# Patient Record
Sex: Female | Born: 1995
Health system: Southern US, Community
[De-identification: ages and names within clinical notes are randomized; demographics above are authoritative.]

## PROBLEM LIST (undated history)

## (undated) DIAGNOSIS — T7840XA Allergy, unspecified, initial encounter: Secondary | ICD-10-CM

## (undated) HISTORY — PX: WISDOM TOOTH EXTRACTION: SHX21

## (undated) HISTORY — PX: ANTERIOR CRUCIATE LIGAMENT REPAIR: SHX115

## (undated) HISTORY — DX: Allergy, unspecified, initial encounter: T78.40XA

## (undated) HISTORY — PX: MENISCUS REPAIR: SHX5179

---
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
## 2015-04-10 ENCOUNTER — Ambulatory Visit (INDEPENDENT_AMBULATORY_CARE_PROVIDER_SITE_OTHER): Payer: PRIVATE HEALTH INSURANCE

## 2015-04-10 ENCOUNTER — Ambulatory Visit (INDEPENDENT_AMBULATORY_CARE_PROVIDER_SITE_OTHER): Payer: PRIVATE HEALTH INSURANCE | Admitting: Emergency Medicine

## 2015-04-10 VITALS — BP 122/76 | HR 61 | Temp 98.3°F | Resp 16 | Ht 65.5 in | Wt 184.0 lb

## 2015-04-10 DIAGNOSIS — N912 Amenorrhea, unspecified: Secondary | ICD-10-CM

## 2015-04-10 LAB — POCT URINE PREGNANCY: PREG TEST UR: NEGATIVE

## 2015-04-10 NOTE — Patient Instructions (Signed)
Wrist Sprain  with Rehab  A sprain is an injury in which a ligament that maintains the proper alignment of a joint is partially or completely torn. The ligaments of the wrist are susceptible to sprains. Sprains are classified into three categories. Grade 1 sprains cause pain, but the tendon is not lengthened. Grade 2 sprains include a lengthened ligament because the ligament is stretched or partially ruptured. With grade 2 sprains there is still function, although the function may be diminished. Grade 3 sprains are characterized by a complete tear of the tendon or muscle, and function is usually impaired.  SYMPTOMS   · Pain tenderness, inflammation, and/or bruising (contusion) of the injury.  · A "pop" or tear felt and/or heard at the time of injury.  · Decreased wrist function.  CAUSES   A wrist sprain occurs when a force is placed on one or more ligaments that is greater than it/they can withstand. Common mechanisms of injury include:  · Catching a ball with you hands.  · Repetitive and/ or strenuous extension or flexion of the wrist.  RISK INCREASES WITH:  · Previous wrist injury.  · Contact sports (boxing or wrestling).  · Activities in which falling is common.  · Poor strength and flexibility.  · Improperly fitted or padded protective equipment.  PREVENTION  · Warm up and stretch properly before activity.  · Allow for adequate recovery between workouts.  · Maintain physical fitness:  ¨ Strength, flexibility, and endurance.  ¨ Cardiovascular fitness.  · Protect the wrist joint by limiting its motion with the use of taping, braces, or splints.  · Protect the wrist after injury for 6 to 12 months.  PROGNOSIS   The prognosis for wrist sprains depends on the degree of injury. Grade 1 sprains require 2 to 6 weeks of treatment. Grade 2 sprains require 6 to 8 weeks of treatment, and grade 3 sprains require up to 12 weeks.   RELATED COMPLICATIONS   · Prolonged healing time, if improperly treated or  re-injured.  · Recurrent symptoms that result in a chronic problem.  · Injury to nearby structures (bone, cartilage, nerves, or tendons).  · Arthritis of the wrist.  · Inability to compete in athletics at a high level.  · Wrist stiffness or weakness.  · Progression to a complete rupture of the ligament.  TREATMENT   Treatment initially involves resting from any activities that aggravate the symptoms, and the use of ice and medications to help reduce pain and inflammation. Your caregiver may recommend immobilizing the wrist for a period of time in order to reduce stress on the ligament and allow for healing. After immobilization it is important to perform strengthening and stretching exercises to help regain strength and a full range of motion. These exercises may be completed at home or with a therapist. Surgery is not usually required for wrist sprains, unless the ligament has been ruptured (grade 3 sprain).  MEDICATION   · If pain medication is necessary, then nonsteroidal anti-inflammatory medications, such as aspirin and ibuprofen, or other minor pain relievers, such as acetaminophen, are often recommended.  · Do not take pain medication for 7 days before surgery.  · Prescription pain relievers may be given if deemed necessary by your caregiver. Use only as directed and only as much as you need.  HEAT AND COLD  · Cold treatment (icing) relieves pain and reduces inflammation. Cold treatment should be applied for 10 to 15 minutes every 2 to 3 hours for inflammation   and pain and immediately after any activity that aggravates your symptoms. Use ice packs or massage the area with a piece of ice (ice massage).  · Heat treatment may be used prior to performing the stretching and strengthening activities prescribed by your caregiver, physical therapist, or athletic trainer. Use a heat pack or soak your injury in warm water.  SEEK MEDICAL CARE IF:  · Treatment seems to offer no benefit, or the condition worsens.  · Any  medications produce adverse side effects.  EXERCISES  RANGE OF MOTION (ROM) AND STRETCHING EXERCISES - Wrist Sprain   These exercises may help you when beginning to rehabilitate your injury. Your symptoms may resolve with or without further involvement from your physician, physical therapist or athletic trainer. While completing these exercises, remember:   · Restoring tissue flexibility helps normal motion to return to the joints. This allows healthier, less painful movement and activity.  · An effective stretch should be held for at least 30 seconds.  · A stretch should never be painful. You should only feel a gentle lengthening or release in the stretched tissue.  RANGE OF MOTION - Wrist Flexion, Active-Assisted  · Extend your right / left elbow with your fingers pointing down.*  · Gently pull the back of your hand towards you until you feel a gentle stretch on the top of your forearm.  · Hold this position for __________ seconds.  Repeat __________ times. Complete this exercise __________ times per day.   *If directed by your physician, physical therapist or athletic trainer, complete this stretch with your elbow bent rather than extended.  RANGE OF MOTION - Wrist Extension, Active-Assisted  · Extend your right / left elbow and turn your palm upwards.*  · Gently pull your palm/fingertips back so your wrist extends and your fingers point more toward the ground.  · You should feel a gentle stretch on the inside of your forearm.  · Hold this position for __________ seconds.  Repeat __________ times. Complete this exercise __________ times per day.  *If directed by your physician, physical therapist or athletic trainer, complete this stretch with your elbow bent, rather than extended.  RANGE OF MOTION - Supination, Active  · Stand or sit with your elbows at your side. Bend your right / left elbow to 90 degrees.  · Turn your palm upward until you feel a gentle stretch on the inside of your forearm.  · Hold this  position for __________ seconds. Slowly release and return to the starting position.  Repeat __________ times. Complete this stretch __________ times per day.   RANGE OF MOTION - Pronation, Active  · Stand or sit with your elbows at your side. Bend your right / left elbow to 90 degrees.  · Turn your palm downward until you feel a gentle stretch on the top of your forearm.  · Hold this position for __________ seconds. Slowly release and return to the starting position.  Repeat __________ times. Complete this stretch __________ times per day.   STRETCH - Wrist Flexion  · Place the back of your right / left hand on a tabletop leaving your elbow slightly bent. Your fingers should point away from your body.  · Gently press the back of your hand down onto the table by straightening your elbow. You should feel a stretch on the top of your forearm.  · Hold this position for __________ seconds.  Repeat __________ times. Complete this stretch __________ times per day.   STRETCH - Wrist   Extension  · Place your right / left fingertips on a tabletop leaving your elbow slightly bent. Your fingers should point backwards.  · Gently press your fingers and palm down onto the table by straightening your elbow. You should feel a stretch on the inside of your forearm.  · Hold this position for __________ seconds.  Repeat __________ times. Complete this stretch __________ times per day.   STRENGTHENING EXERCISES - Wrist Sprain  These exercises may help you when beginning to rehabilitate your injury. They may resolve your symptoms with or without further involvement from your physician, physical therapist or athletic trainer. While completing these exercises, remember:   · Muscles can gain both the endurance and the strength needed for everyday activities through controlled exercises.  · Complete these exercises as instructed by your physician, physical therapist or athletic trainer. Progress with the resistance and repetition exercises  only as your caregiver advises.  STRENGTH - Wrist Flexors  · Sit with your right / left forearm palm-up and fully supported. Your elbow should be resting below the height of your shoulder. Allow your wrist to extend over the edge of the surface.  · Loosely holding a __________ weight or a piece of rubber exercise band/tubing, slowly curl your hand up toward your forearm.  · Hold this position for __________ seconds. Slowly lower the wrist back to the starting position in a controlled manner.  Repeat __________ times. Complete this exercise __________ times per day.   STRENGTH - Wrist Extensors  · Sit with your right / left forearm palm-down and fully supported. Your elbow should be resting below the height of your shoulder. Allow your wrist to extend over the edge of the surface.  · Loosely holding a __________ weight or a piece of rubber exercise band/tubing, slowly curl your hand up toward your forearm.  · Hold this position for __________ seconds. Slowly lower the wrist back to the starting position in a controlled manner.  Repeat __________ times. Complete this exercise __________ times per day.   STRENGTH - Ulnar Deviators  · Stand with a ____________________ weight in your right / left hand, or sit holding on to the rubber exercise band/tubing with your opposite arm supported.  · Move your wrist so that your pinkie travels toward your forearm and your thumb moves away from your forearm.  · Hold this position for __________ seconds and then slowly lower the wrist back to the starting position.  Repeat __________ times. Complete this exercise __________ times per day  STRENGTH - Radial Deviators  · Stand with a ____________________ weight in your  · right / left hand, or sit holding on to the rubber exercise band/tubing with your arm supported.  · Raise your hand upward in front of you or pull up on the rubber tubing.  · Hold this position for __________ seconds and then slowly lower the wrist back to the  starting position.  Repeat __________ times. Complete this exercise __________ times per day.  STRENGTH - Forearm Supinators  · Sit with your right / left forearm supported on a table, keeping your elbow below shoulder height. Rest your hand over the edge, palm down.  · Gently grip a hammer or a soup ladle.  · Without moving your elbow, slowly turn your palm and hand upward to a "thumbs-up" position.  · Hold this position for __________ seconds. Slowly return to the starting position.  Repeat __________ times. Complete this exercise __________ times per day.   STRENGTH - Forearm   Pronators  · Sit with your right / left forearm supported on a table, keeping your elbow below shoulder height. Rest your hand over the edge, palm up.  · Gently grip a hammer or a soup ladle.  · Without moving your elbow, slowly turn your palm and hand upward to a "thumbs-up" position.  · Hold this position for __________ seconds. Slowly return to the starting position.  Repeat __________ times. Complete this exercise __________ times per day.   STRENGTH - Grip  · Grasp a tennis ball, a dense sponge, or a large, rolled sock in your hand.  · Squeeze as hard as you can without increasing any pain.  · Hold this position for __________ seconds. Release your grip slowly.  Repeat __________ times. Complete this exercise __________ times per day.   Document Released: 09/07/2005 Document Revised: 11/30/2011 Document Reviewed: 12/20/2008  ExitCare® Patient Information ©2015 [employer]. This information is not intended to replace advice given to you by your health care provider. Make sure you discuss any questions you have with your health care provider.

## 2015-04-10 NOTE — Progress Notes (Signed)
Patient ID: Brandy Lee, female   DOB: 1996/09/14, 19 y.o.   MRN: [id number]     This chart was scribed for Lesle Chris, MD by Littie Deeds, Medical Scribe. This patient was seen in room 2 and the patient's care was started at 3:22 PM.   Chief Complaint:  Chief Complaint  Patient presents with  . Wrist Injury    last night, right    HPI: Brandy Lee is a 19 y.o. female who reports to [facility] today complaining of sudden onset, worsening right wrist pain that started yesterday after lifting a cinder block. Last night, she had some difficulty with ROM of her wrist, which worsened this morning. She could not make a fist this morning. Patient has been icing her wrist and taking ibuprofen, which has helped with her ROM. She is not sexually active and denies possibility of pregnancy.   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  Past Medical History  Diagnosis Date  . Allergy    History reviewed. No pertinent past surgical history. History   Social History  . Marital Status: Single    Spouse Name: N/A  . Number of Children: N/A  . Years of Education: N/A   Social History Main Topics  . Smoking status: Never Smoker   . Smokeless tobacco: Not on file  . Alcohol Use: Not on file  . Drug Use: Not on file  . Sexual Activity: Not on file   Other Topics Concern  . None   Social History Narrative  . None   Family History  Problem Relation Age of Onset  . Hypertension Mother    No Known Allergies Prior to Admission medications   Medication Sig Start Date End Date Taking? Authorizing Provider  metFORMIN (GLUCOPHAGE) 500 MG tablet Take by mouth 2 (two) times daily with a meal.   Yes Historical Provider, MD     ROS: The patient denies fevers, chills, night sweats, unintentional weight loss, chest pain, palpitations, wheezing, dyspnea on exertion, nausea, vomiting, abdominal pain, dysuria, hematuria, melena, numbness, weakness, or tingling.   All  other systems have been reviewed and were otherwise negative with the exception of those mentioned in the HPI and as above.    PHYSICAL EXAM: Filed Vitals:   04/10/15 1518  BP: 122/76  Pulse: 61  Temp: 98.3 F (36.8 C)  Resp: 16   Body mass index is 30.14 kg/(m^2).   General: Alert, no acute distress HEENT:  Normocephalic, atraumatic, oropharynx patent. Eye: Nonie Hoyer [facility] Cardiovascular:  Regular rate and rhythm, no rubs murmurs or gallops.  No Carotid bruits, radial pulse intact. No pedal edema.  Respiratory: Clear to auscultation bilaterally.  No wheezes, rales, or rhonchi.  No cyanosis, no use of accessory musculature Abdominal: No organomegaly, abdomen is soft and non-tender, positive bowel sounds.  No masses. Musculoskeletal: Gait intact. No edema. Tender along the ulnar styloid and ulnar flexor attachments to the carpal bones Skin: No rashes. Neurologic: Facial musculature symmetric. Psychiatric: Patient acts appropriately throughout our interaction. Lymphatic: No cervical or submandibular lymphadenopathy Genitourinary/Anorectal: No acute findings    LABS: Results for orders placed or performed in visit on 04/10/15  POCT urine pregnancy  Result Value Ref Range   Preg Test, Ur Negative Negative      EKG/XRAY:   Primary read interpreted by Dr. Cleta Alberts at [facility]. No fracture seen   ASSESSMENT/PLAN: I did not see a fracture on x-ray. Will treat with a splint ice and ibuprofen.  She was given a note for work to not do any heavy lifting with the right wrist.I personally performed the services described in this documentation, which was scribed in my presence. The recorded information has been reviewed and is accurate.  Earl LitesSteve Daub, MD     Gross sideeffects, risk and benefits, and alternatives of medications d/w patient. Patient is aware that all medications have potential sideeffects and we are unable to predict every sideeffect or drug-drug interaction that may  occur.  Lesle ChrisSteven Daub MD 04/10/2015 3:22 PM

## 2015-09-19 ENCOUNTER — Encounter: Payer: PRIVATE HEALTH INSURANCE | Admitting: Certified Nurse Midwife

## 2016-12-08 NOTE — Progress Notes (Signed)
[name] [address] [place], [place] [address] Phone: [phone] Subjective:    I'm seeing this patient by the request  of:    CC: Back pain  WGN:[id number]  Brandy Lee is a 21 y.o. female coming in with complaint of back pain  Patient is a Engineer, maintenance (IT). Patient has had pain more recently. Patient has seen other providers for this. Was told that she had a potential bulging disc. Had been working with athletic trainers but continued to work on her rowing. Never seem to get completely better. Patient has seen a chiropractor with minimal improvement. Patient states that the pain seemed to stay localized but did start with sciatica. Patient started having worsening worsening pain in the buttocks area as well as radiating to the leg somewhat. Patient rates the severity of pain as 5 out of 10. Seems to be on time. Anytime she tries to work on though she has worsening symptoms.     Past Medical History:  Diagnosis Date  . Allergy    Past Surgical History:  Procedure Laterality Date  . ANTERIOR CRUCIATE LIGAMENT REPAIR    . MENISCUS REPAIR    . WISDOM TOOTH EXTRACTION     Social History   Social History  . Marital status: Single    Spouse name: N/A  . Number of children: N/A  . Years of education: N/A   Social History Main Topics  . Smoking status: Never Smoker  . Smokeless tobacco: Never Used  . Alcohol use Not on file  . Drug use: Unknown  . Sexual activity: Not on file   Other Topics Concern  . Not on file   Social History Narrative  . No narrative on file   No Known Allergies Family History  Problem Relation Age of Onset  . Hypertension Mother     Past medical history, social, surgical and family history all reviewed in electronic medical record.  No pertanent information unless stated regarding to the chief complaint.   Review of Systems:Review of systems updated and as accurate as of 12/09/16  No headache, visual changes,  nausea, vomiting, diarrhea, constipation, dizziness, abdominal pain, skin rash, fevers, chills, night sweats, weight loss, swollen lymph nodes, body aches, joint swelling, muscle aches, chest pain, shortness of breath, mood changes.   Objective  Blood pressure 118/82, pulse 64, height 5' 4.5" (1.638 m), weight 193 lb (87.5 kg). Systems examined below as of 12/09/16   General: No apparent distress alert and oriented x3 mood and affect normal, dressed appropriately.  HEENT: Pupils equal, extraocular movements intact  Respiratory: Patient's speak in full sentences and does not appear short of breath  Cardiovascular: No lower extremity edema, non tender, no erythema  Skin: Warm dry intact with no signs of infection or rash on extremities or on axial skeleton.  Abdomen: Soft nontender  Neuro: Cranial nerves II through XII are intact, neurovascularly intact in all extremities with 2+ DTRs and 2+ pulses.  Lymph: No lymphadenopathy of posterior or anterior cervical chain or axillae bilaterally.  Gait normal with good balance and coordination.  MSK:  Non tender with full range of motion and good stability and symmetric strength and tone of shoulders, elbows, wrist, hip, knee and ankles bilaterally.  Back Exam:  Inspection: Loss of lordosis Motion: Flexion 45 deg, Extension 15 deg, Side Bending to 45 deg bilaterally,  Rotation to 45 deg bilaterally  SLR laying: Negative  XSLR laying: Negative  Palpable tenderness: Tender to palpation in  the paraspinal musculature of the lumbar spine right greater than left. Patient also has severe tenderness over the right sacroiliac joint.Marland Kitchen. FABER: negative. Sensory change: Gross sensation intact to all lumbar and sacral dermatomes.  Reflexes: 2+ at both patellar tendons, 2+ at achilles tendons, Babinski's downgoing.  Strength at foot  Plantar-flexion: 5/5 Dorsi-flexion: 5/5 Eversion: 5/5 Inversion: 5/5  Leg strength  Quad: 5/5 Hamstring: 5/5 Hip flexor: 5/5 Hip  abductors: 5/5  Gait unremarkable. Positive stork test   procedure note 97110; 15 minutes spent for Therapeutic exercises as stated in above notes.  This included exercises focusing on stretching, strengthening, with significant focus on eccentric aspects.  Low back exercises that included:  Pelvic tilt/bracing instruction to focus on control of the pelvic girdle and lower abdominal muscles  Glute strengthening exercises, focusing on proper firing of the glutes without engaging the low back muscles Proper stretching techniques for maximum relief for the hamstrings, hip flexors, low back and some rotation where tolerated   Proper technique shown and discussed handout in great detail with ATC.  All questions were discussed and answered.   Impression and Recommendations:     This case required medical decision making of moderate complexity.      Note: This dictation was prepared with Dragon dictation along with smaller phrase technology. Any transcriptional errors that result from this process are unintentional.

## 2016-12-09 ENCOUNTER — Ambulatory Visit (INDEPENDENT_AMBULATORY_CARE_PROVIDER_SITE_OTHER): Payer: PRIVATE HEALTH INSURANCE | Admitting: Family Medicine

## 2016-12-09 ENCOUNTER — Encounter: Payer: Self-pay | Admitting: Family Medicine

## 2016-12-09 ENCOUNTER — Telehealth: Payer: Self-pay | Admitting: General Practice

## 2016-12-09 DIAGNOSIS — M4846XA Fatigue fracture of vertebra, lumbar region, initial encounter for fracture: Secondary | ICD-10-CM

## 2016-12-09 MED ORDER — VITAMIN D (ERGOCALCIFEROL) 1.25 MG (50000 UNIT) PO CAPS: 50000.0000 [IU] | ORAL_CAPSULE | ORAL | 0 refills | Status: DC

## 2016-12-09 NOTE — Patient Instructions (Signed)
Good to see you  Ice 20 minutes 2 times daily. Usually after activity and before bed. Exercises 3 times a week.  I would avoid all back extension for now.  Once weekly vitamin D for 12 weeks.  Continue with the iron Nexium or prilosec over the counter for 2 weeks Turmeric 500mg  twice daily  See me again in 3 weeks and we will make sure you are doing well.

## 2016-12-09 NOTE — Assessment & Plan Note (Signed)
I believe the patient has more of a stress fracture. 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 Patient then had more pain with extension and less pain with flexion. This goes against a potential for a herniated disc. Asian did have radicular symptoms that I think is more secondary to the tightness of the hamstrings. We discussed with patient about icing regimen, home exercises, we discussed which activities to avoid. 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 We discussed icing regimen and once weekly vitamin D to help with muscle strength and endurance as well as bone healing. We discussed the importance of core strength. Follow-up again in 4 weeks. May be a candidate for manipulation.

## 2016-12-09 NOTE — Telephone Encounter (Signed)
Pt mother has some question about the turmeric and the interaction to her birth control

## 2016-12-10 NOTE — Telephone Encounter (Signed)
Called patient and let her know that Dr. Katrinka BlazingSmith said that there should not be any interaction between the two medications.

## 2016-12-15 ENCOUNTER — Telehealth: Payer: Self-pay | Admitting: Family Medicine

## 2016-12-15 NOTE — Telephone Encounter (Signed)
Mother called in stating that [facility] was having a lot of questions in regard to what diagnosis Dr. Katrinka BlazingSmith gave her. Mother would like Dr. Katrinka BlazingSmith to be able to talk with them by phone.  She would also like to know if Dr. Katrinka BlazingSmith received records from [facility].  Please follow up in regard.

## 2016-12-16 NOTE — Telephone Encounter (Signed)
Left message for patient to return my call.

## 2016-12-16 NOTE — Telephone Encounter (Signed)
Patient returned call. Let her know Dr. Katrinka BlazingSmith saw a stress fracture at L4-L5. Radicular symptoms are coming from posterior musculature tightness. Patient wants to run on Alter-G. Dr. Katrinka BlazingSmith recommends that she bike but if she has to use Alter-G to be at less than 30% of her body weight. Patient hands phone over to athletic trainer for me to discuss rehab plan with them. Let them know that patient is to refrain from extension as she has a stress fracture at L4-L5. [facility] states they will make modifications in rehab and performance training for patient. Ask about underwater treadmill running and again tell them that it has to be 30% of her body weight or less.

## 2016-12-30 ENCOUNTER — Ambulatory Visit (INDEPENDENT_AMBULATORY_CARE_PROVIDER_SITE_OTHER): Payer: PRIVATE HEALTH INSURANCE | Admitting: Family Medicine

## 2016-12-30 ENCOUNTER — Encounter: Payer: Self-pay | Admitting: Family Medicine

## 2016-12-30 DIAGNOSIS — M999 Biomechanical lesion, unspecified: Secondary | ICD-10-CM | POA: Diagnosis not present

## 2016-12-30 DIAGNOSIS — M4846XD Fatigue fracture of vertebra, lumbar region, subsequent encounter for fracture with routine healing: Secondary | ICD-10-CM

## 2016-12-30 NOTE — Assessment & Plan Note (Signed)
Decision today to treat with OMT was based on Physical Exam  After verbal consent patient was treated with HVLA, ME, FPR techniques in cervical, thoracic, lumbar and sacral areas  Patient tolerated the procedure well with improvement in symptoms  Patient given exercises, stretches and lifestyle modifications  See medications in patient instructions if given  Patient will follow up in 4-6 weeks 

## 2016-12-30 NOTE — Assessment & Plan Note (Signed)
Improving at this time. Encourage her to finish up with the 12 weeks of the vitamin D. We have advanced accordingly. Given information to her athletic trainer. Patient will follow-up with me again in 4-6 weeks.

## 2016-12-30 NOTE — Progress Notes (Signed)
[facility] [address] [place], [place] [address] Phone: [phone] Subjective:    I'm seeing this patient by the request  of:    CC: Back pain f/u  [id number]  Brandy Lee is a 21 y.o. 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 Treated for a stress fracture. Doing once weekly vitamin D and has noticed significant improvement. Patient states that she is feeling 90% better. Denies any radiation down the legs any numbness or tingling. States that she has increased range of motion. Has been avoiding any high impact exercises.     Past Medical History:  Diagnosis Date  . Allergy    Past Surgical History:  Procedure Laterality Date  . ANTERIOR CRUCIATE LIGAMENT REPAIR    . MENISCUS REPAIR    . WISDOM TOOTH EXTRACTION     Social History   Social History  . Marital status: Single    Spouse name: N/A  . Number of children: N/A  . Years of education: N/A   Social History Main Topics  . Smoking status: Never Smoker  . Smokeless tobacco: Never Used  . Alcohol use None  . Drug use: Unknown  . Sexual activity: Not Asked   Other Topics Concern  . None   Social History Narrative  . None   No Known Allergies Family History  Problem Relation Age of Onset  . Hypertension Mother     Past medical history, social, surgical and family history all reviewed in electronic medical record.  No pertanent information unless stated regarding to the chief complaint.   Review of Systems: No headache, visual changes, nausea, vomiting, diarrhea, constipation, dizziness, abdominal pain, skin rash, fevers, chills, night sweats, weight loss, swollen lymph nodes, body aches, joint swelling chest pain, shortness of breath, mood changes.  Positive muscle aches  Objective  Blood pressure 114/84, pulse (!) 58, resp. rate 16, weight 192 lb (87.1 kg), SpO2 98 %.   Systems  examined below as of 12/30/16 General: NAD A&O x3 mood, affect normal  HEENT: Pupils equal, extraocular movements intact no nystagmus Respiratory: not short of breath at rest or with speaking Cardiovascular: No lower extremity edema, non tender Skin: Warm dry intact with no signs of infection or rash on extremities or on axial skeleton. Abdomen: Soft nontender, no masses Neuro: Cranial nerves  intact, neurovascularly intact in all extremities with 2+ DTRs and 2+ pulses. Lymph: No lymphadenopathy appreciated today  Gait normal with good balance and coordination.  MSK: Non tender with full range of motion and good stability and symmetric strength and tone of shoulders, elbows, wrist,  knee hips and ankles bilaterally.   Back Exam:  Inspection: Loss of lordosis Motion: Flexion 45 deg, Extension 15 deg, Side Bending to 45 deg bilaterally,  Rotation to 45 deg bilaterally  SLR laying: Negative  XSLR laying: Negative  Palpable tenderness: Very mild tenderness still with extension over the right paraspinal musculature. FABER: negative. Sensory change: Gross sensation intact to all lumbar and sacral dermatomes.  Reflexes: 2+ at both patellar tendons, 2+ at achilles tendons, Babinski's downgoing.  Strength at foot  Plantar-flexion: 5/5 Dorsi-flexion: 5/5 Eversion: 5/5 Inversion: 5/5  Leg strength  Quad: 5/5 Hamstring: 5/5 Hip flexor: 5/5 Hip abductors: 5/5  Gait unremarkable. Negative stork test today    Osteopathic findings Cervical C2 flexed rotated and side bent right C4 flexed rotated and side bent left C6  flexed rotated and side bent left T3 extended rotated and side bent right inhaled third rib T9 extended rotated and side bent left L2 flexed rotated and side bent right Sacrum right on right   Impression and Recommendations:     This case required medical decision making of moderate complexity.      Note: This dictation was prepared with Dragon dictation along with smaller  phrase technology. Any transcriptional errors that result from this process are unintentional.

## 2016-12-30 NOTE — Patient Instructions (Signed)
Good to see you  Brandy Lee is your friend.  OK to increase 10% a week with strength and weight I am ok to do the PT exercises now only 2 times a week.  OK to do alta G and elliptical and biking.  Still avoid jumping and repetitive back extension.  You are doing great  One more time in 4 weeks and we will get you back to rowing!

## 2016-12-30 NOTE — Progress Notes (Signed)
Pre-visit discussion using our clinic review tool. No additional management support is needed unless otherwise documented below in the visit note.  

## 2017-02-02 NOTE — Progress Notes (Signed)
[facility] [address] [place], [place] [address] Phone: [phone] Subjective:    I'm seeing this patient by the request  of:    CC: Back pain f/u  [id number]  Brandy Lee is a 21 y.o. 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 Treated for a stress fracture. Patient has been feeling much better. Patient is not in season anymore. Patient states some mild tightness but nothing severe the lower back. No radiation of the pain. Patient states that she has been running a little bit more. Has noticed some right knee pain. Otherwise his back seems to be doing very well just some stiffness at the end of certain activities.  Patient is also having the right knee pain. Seems to be worse with going up or down hills. Seems on the superior lateral aspect. History of an anterior cruciate ligament repair as well as meniscal surgery. Patient denies any instability. Just more of a soreness. Finds it difficult to increase activity during her off-season training secondary to this pain.      Past Medical History:  Diagnosis Date  . Allergy    Past Surgical History:  Procedure Laterality Date  . ANTERIOR CRUCIATE LIGAMENT REPAIR    . MENISCUS REPAIR    . WISDOM TOOTH EXTRACTION     Social History   Social History  . Marital status: Single    Spouse name: N/A  . Number of children: N/A  . Years of education: N/A   Social History Main Topics  . Smoking status: Never Smoker  . Smokeless tobacco: Never Used  . Alcohol use Not on file  . Drug use: Unknown  . Sexual activity: Not on file   Other Topics Concern  . Not on file   Social History Narrative  . No narrative on file   No Known Allergies Family History  Problem Relation Age of Onset  . Hypertension Mother     Past medical history, social, surgical and family history all reviewed in electronic medical  record.  No pertanent information unless stated regarding to the chief complaint.   Review of Systems: No headache, visual changes, nausea, vomiting, diarrhea, constipation, dizziness, abdominal pain, skin rash, fevers, chills, night sweats, weight loss, swollen lymph nodes, body aches, joint swelling,  chest pain, shortness of breath, mood changes.  Positive muscle aches  Objective  There were no vitals taken for this visit.   Systems examined below as of 02/03/17 General: NAD A&O x3 mood, affect normal  HEENT: Pupils equal, extraocular movements intact no nystagmus Respiratory: not short of breath at rest or with speaking Cardiovascular: No lower extremity edema, non tender Skin: Warm dry intact with no signs of infection or rash on extremities or on axial skeleton. Abdomen: Soft nontender, no masses Neuro: Cranial nerves  intact, neurovascularly intact in all extremities with 2+ DTRs and 2+ pulses. Lymph: No lymphadenopathy appreciated today  Gait normal with good balance and coordination.  MSK: Non tender with full range of motion and good stability and symmetric strength and tone of shoulders, elbows, wrist, hips and ankles bilaterally.   Back Exam:  Inspection: Improvement in lordosis Motion: Flexion 40 deg, Extension 25 deg, Side Bending to 40 deg bilaterally,  Rotation to 45 deg bilaterally  SLR laying: Negative  XSLR laying: Negative  Palpable tenderness: Mild pain in the thoracolumbar juncture on the right side FABER: negative. Sensory  change: Gross sensation intact to all lumbar and sacral dermatomes.  Reflexes: 2+ at both patellar tendons, 2+ at achilles tendons, Babinski's downgoing.  Strength at foot  Plantar-flexion: 5/5 Dorsi-flexion: 5/5 Eversion: 5/5 Inversion: 5/5  Leg strength  Quad: 5/5 Hamstring: 5/5 Hip flexor: 5/5 Hip abductors: 5/5  Gait unremarkable. Negative stork test today  Knee:Right Normal to inspection with no erythema or effusion or obvious bony  abnormalities. Mild pain over the patellofemoral joint ROM full in flexion and extension and lower leg rotation. Ligaments with solid consistent endpoints including ACL, PCL, LCL, MCL. Negative Mcmurray's, Apley's, and Thessalonian tests. Mild painful patellar compression. Patellar glide with moderate crepitus. Patellar and quadriceps tendons unremarkable. Hamstring and quadriceps strength is normal.  Contralateral knee unremarkable     Osteopathic findings Cervical C2 flexed rotated and side bent right C4 flexed rotated and side bent left C6 flexed rotated and side bent left T3 extended rotated and side bent right inhaled third rib T9 extended rotated and side bent left L2 flexed rotated and side bent right Sacrum right on right   Impression and Recommendations:     This case required medical decision making of moderate complexity.      Note: This dictation was prepared with Dragon dictation along with smaller phrase technology. Any transcriptional errors that result from this process are unintentional.

## 2017-02-03 ENCOUNTER — Ambulatory Visit (INDEPENDENT_AMBULATORY_CARE_PROVIDER_SITE_OTHER): Payer: PRIVATE HEALTH INSURANCE | Admitting: Family Medicine

## 2017-02-03 ENCOUNTER — Encounter: Payer: Self-pay | Admitting: Family Medicine

## 2017-02-03 VITALS — BP 126/80 | HR 56 | Ht 64.5 in | Wt 186.0 lb

## 2017-02-03 DIAGNOSIS — M4846XD Fatigue fracture of vertebra, lumbar region, subsequent encounter for fracture with routine healing: Secondary | ICD-10-CM

## 2017-02-03 DIAGNOSIS — M25561 Pain in right knee: Secondary | ICD-10-CM

## 2017-02-03 DIAGNOSIS — M999 Biomechanical lesion, unspecified: Secondary | ICD-10-CM

## 2017-02-03 NOTE — Assessment & Plan Note (Signed)
Patellofemoral Syndrome  Reviewed anatomy using anatomical model and how PFS occurs.  Given rehab exercises handout for VMO, hip abductors, core, entire kinetic chain including proprioception exercises including cone touches, step downs, hip elevations and turn outs.  Could benefit from PT, regular exercise, upright biking, and a PFS knee brace to assist with tracking abnormalities. rtc in 4 weeks.

## 2017-02-03 NOTE — Assessment & Plan Note (Signed)
Decision today to treat with OMT was based on Physical Exam  After verbal consent patient was treated with HVLA, ME, FPR techniques in cervical, thoracic, lumbar and sacral areas  Patient tolerated the procedure well with improvement in symptoms  Patient given exercises, stretches and lifestyle modifications  See medications in patient instructions if given  Patient will follow up in 6-8 weeks 

## 2017-02-03 NOTE — Assessment & Plan Note (Signed)
Believe that this is resolved at this time. Patient does have some tightness of the hip flexors and has responded well to manipulation. We will continue with conservative therapy and icing regimen. Encourage her to continue the over-the-counter medications. We discussed icing regimen. Advance activity as tolerate. Follow-up again in 6-8 weeks.

## 2017-02-03 NOTE — Patient Instructions (Signed)
Good to see you  New exercises for the knee.  Wear brace only with running and jumping.  Stay active.  Keep working on the core for the back  You will do great  See me right before yu leave for [place].

## 2017-03-12 ENCOUNTER — Encounter: Payer: Self-pay | Admitting: Physician Assistant

## 2017-03-12 ENCOUNTER — Ambulatory Visit (INDEPENDENT_AMBULATORY_CARE_PROVIDER_SITE_OTHER): Payer: PRIVATE HEALTH INSURANCE | Admitting: Physician Assistant

## 2017-03-12 VITALS — BP 123/80 | HR 85 | Temp 98.4°F | Resp 16 | Ht 64.65 in | Wt 185.0 lb

## 2017-03-12 DIAGNOSIS — R05 Cough: Secondary | ICD-10-CM | POA: Diagnosis not present

## 2017-03-12 DIAGNOSIS — R059 Cough, unspecified: Secondary | ICD-10-CM

## 2017-03-12 MED ORDER — DOXYCYCLINE HYCLATE 100 MG PO TABS: 100.0000 mg | ORAL_TABLET | Freq: Two times a day (BID) | ORAL | 0 refills | Status: DC

## 2017-03-12 MED ORDER — PREDNISONE 10 MG PO TABS: ORAL_TABLET | ORAL | 0 refills | Status: AC

## 2017-03-12 MED ORDER — GUAIFENESIN ER 1200 MG PO TB12: 1.0000 | ORAL_TABLET | Freq: Two times a day (BID) | ORAL | 0 refills | Status: AC

## 2017-03-12 MED ORDER — HYDROCODONE-HOMATROPINE 5-1.5 MG/5ML PO SYRP: 5.0000 mL | ORAL_SOLUTION | Freq: Three times a day (TID) | ORAL | 0 refills | Status: DC | PRN

## 2017-03-12 NOTE — Patient Instructions (Addendum)
Sudafed - short acting - take 30 mins prior to take off -- this should last about 4 hours Afrin - nose spray - use 30 mins prior to take off - this should last about 4 hours  Take both of these prior to decent also (if the flight is longer than 4 hours)  mucinex to help with congestion  - start cough medication now -   - start prednisone if you continue dry cough after 2-3 days of the cough medication  - if you start to have a productive cough all day (not just in the am) of fevers or start feeling bad - start the abx - take with food but no milk products 2 hours before or after taking the pill   IF you received an x-ray today, you will receive an invoice from [facility]. Please contact [facility] at [phone] with questions or concerns regarding your invoice.   IF you received labwork today, you will receive an invoice from [facility]. Please contact [facility] at [phone] with questions or concerns regarding your invoice.   Our billing staff will not be able to assist you with questions regarding bills from these companies.  You will be contacted with the lab results as soon as they are available. The fastest way to get your results is to activate your My Chart account. Instructions are located on the last page of this paperwork. If you have not heard from us regarding the results in 2 weeks, please contact this office.

## 2017-03-12 NOTE — Progress Notes (Signed)
Brandy Cage[facility]oe Lee  MRN: [id number] DOB: 1996/05/23  PCP: Patient, No Pcp Per  Chief Complaint  Patient presents with  . Cough    non stop coughing for past 3-4 days, coughing spells thru out the night causing pt to be unable to sleep  . Influenza    dx on 6/15 w/ flu, finished tamiflu, dx tuesday w/ ear infection and pt given amox 800 mg bid and afloxin ear gtts for ear infection    Subjective:  Pt presents to clinic for continue coughing. She was diagnosed with Flu A on 6/15 - she has overall felt better but she cannot stop coughing - she was up most of the night coughing.  The cough is deep in her chest but feels like a tickle that gets worse also after she takes a deep breath.  It is a non-productive cough.  Flying to [place] later today for study abroad - through August  [employer] student  Review of Systems  Constitutional: Negative for chills and fever.  HENT: Positive for congestion, ear pain and postnasal drip (at night while laying down). Negative for ear discharge.   Respiratory: Positive for cough. Negative for shortness of breath and wheezing.        No asthma history - nonsmoker    Patient Active Problem List   Diagnosis Date Noted  . Patellofemoral arthralgia of right knee 02/03/2017  . Nonallopathic lesion of lumbosacral region 12/30/2016  . Nonallopathic lesion of sacral region 12/30/2016  . Nonallopathic lesion of thoracic region 12/30/2016  . Lumbar stress fracture 12/09/2016  . DERMATOPHYTOSIS OF NAIL 03/14/2009  . KNEE PAIN, RIGHT 03/14/2009  . PAIN IN JOINT, ANKLE AND FOOT 03/14/2009    Current Outpatient Prescriptions on File Prior to Visit  Medication Sig Dispense Refill  . iron polysaccharides (NIFEREX) 150 MG capsule Take 150 mg by mouth daily.    . norgestimate-ethinyl estradiol (ORTHO-CYCLEN,SPRINTEC,PREVIFEM) 0.25-35 MG-MCG tablet Take 1 tablet by mouth daily.    . vitamin C (ASCORBIC ACID) 500 MG tablet Take 500 mg by mouth daily.     No current  facility-administered medications on file prior to visit.     Not on File  Pt patients past, family and social history were reviewed and updated.   Objective:  BP 123/80 (BP Location: Right Arm, Patient Position: Sitting, Cuff Size: Normal)   Pulse 85   Temp 98.4 F (36.9 C) (Oral)   Resp 16   Ht 5' 4.65" (1.642 m)   Wt 185 lb (83.9 kg)   LMP 03/01/2017   SpO2 100%   BMI 31.12 kg/m   Physical Exam  Constitutional: She is oriented to person, place, and time and well-developed, well-nourished, and in no distress.  HENT:  Head: Normocephalic and atraumatic.  Right Ear: Hearing, tympanic membrane, external ear and ear canal normal.  Left Ear: Hearing, tympanic membrane, external ear and ear canal normal.  Nose: Mucosal edema present.  Mouth/Throat: Uvula is midline, oropharynx is clear and moist and mucous membranes are normal.  Eyes: Conjunctivae are normal.  Neck: Normal range of motion.  Cardiovascular: Normal rate, regular rhythm and normal heart sounds.   No murmur heard. Pulmonary/Chest: Effort normal and breath sounds normal. She has no wheezes. She exhibits no tenderness.  Neurological: She is alert and oriented to person, place, and time. Gait normal.  Skin: Skin is warm and dry.  Psychiatric: Mood, memory, affect and judgment normal.  Vitals reviewed.   Assessment and Plan :  Cough - Plan: HYDROcodone-homatropine (  HYCODAN) 5-1.5 MG/5ML syrup, predniSONE (DELTASONE) 10 MG tablet, doxycycline (VIBRA-TABS) 100 MG tablet, Guaifenesin (MUCINEX MAXIMUM STRENGTH) 1200 MG TB12   I suspect that the patient is on the end of the flu and she needs only help with her cough - due to her leaving the country I have written her for prednisone and abx and we have discussed when she should take them.  Her mom is going with her and she also understands the timing of when to take the medications.  She should use mucinex to help with the residual mucus.  She was instructed to sign up for  mychart which would allow her to communicate with me once abroad.  Benny Lennert PA-C  Primary Care at [facility] 03/12/2017 11:30 AM

## 2017-10-04 ENCOUNTER — Ambulatory Visit: Payer: PRIVATE HEALTH INSURANCE | Admitting: Family Medicine

## 2017-10-04 ENCOUNTER — Telehealth: Payer: Self-pay | Admitting: Family Medicine

## 2017-10-04 VITALS — BP 122/90 | HR 73 | Ht 65.0 in | Wt 198.0 lb

## 2017-10-04 DIAGNOSIS — S93492A Sprain of other ligament of left ankle, initial encounter: Secondary | ICD-10-CM

## 2017-10-04 DIAGNOSIS — S93402A Sprain of unspecified ligament of left ankle, initial encounter: Secondary | ICD-10-CM | POA: Insufficient documentation

## 2017-10-04 DIAGNOSIS — M5416 Radiculopathy, lumbar region: Secondary | ICD-10-CM | POA: Diagnosis not present

## 2017-10-04 DIAGNOSIS — M545 Low back pain, unspecified: Secondary | ICD-10-CM

## 2017-10-04 MED ORDER — PREDNISONE 50 MG PO TABS
50.0000 mg | ORAL_TABLET | Freq: Every day | ORAL | 0 refills | Status: DC
Start: 2017-10-04 — End: 2020-09-05

## 2017-10-04 MED ORDER — GABAPENTIN 100 MG PO CAPS: 200.0000 mg | ORAL_CAPSULE | Freq: Every day | ORAL | 3 refills | Status: DC

## 2017-10-04 NOTE — Progress Notes (Signed)
[name] [address] [place], [place] [address] Phone: [phone] Subjective:    I'm seeing this patient by the request  of:    CC: Back pain, left ankle injury  [id number]  Brandy Lee is a 22 y.o. female coming in for follow up for back pain. She said that the left side of her back has continued to be tight since we last saw her. She slept on a soft mattress over break which caused some pain. She sprained her left ankle two days ago. She wore a walking boot over the weekend which seemed to exacerbate her back. She has pain in her ankle with movement but not with weight bearing.       Past Medical History:  Diagnosis Date  . Allergy    Past Surgical History:  Procedure Laterality Date  . ANTERIOR CRUCIATE LIGAMENT REPAIR    . MENISCUS REPAIR    . WISDOM TOOTH EXTRACTION     Social History   Socioeconomic History  . Marital status: Single    Spouse name: Not on file  . Number of children: Not on file  . Years of education: Not on file  . Highest education level: Not on file  Social Needs  . Financial resource strain: Not on file  . Food insecurity - worry: Not on file  . Food insecurity - inability: Not on file  . Transportation needs - medical: Not on file  . Transportation needs - non-medical: Not on file  Occupational History  . Not on file  Tobacco Use  . Smoking status: Never Smoker  . Smokeless tobacco: Never Used  Substance and Sexual Activity  . Alcohol use: Not on file  . Drug use: Not on file  . Sexual activity: Not on file  Other Topics Concern  . Not on file  Social History Narrative  . Not on file   Not on File Family History  Problem Relation Age of Onset  . Hypertension Mother      Past medical history, social, surgical and family history all reviewed in electronic medical record.  No pertanent information unless stated regarding to the chief complaint.   Review of Systems:Review of systems updated  and as accurate as of 10/04/17  No headache, visual changes, nausea, vomiting, diarrhea, constipation, dizziness, abdominal pain, skin rash, fevers, chills, night sweats, weight loss, swollen lymph nodes, body aches, joint swelling, muscle aches, chest pain, shortness of breath, mood changes.   Objective  There were no vitals taken for this visit. Systems examined below as of 10/04/17   General: No apparent distress alert and oriented x3 mood and affect normal, dressed appropriately.  HEENT: Pupils equal, extraocular movements intact  Respiratory: Patient's speak in full sentences and does not appear short of breath  Cardiovascular: No lower extremity edema, non tender, no erythema  Skin: Warm dry intact with no signs of infection or rash on extremities or on axial skeleton.  Abdomen: Soft nontender  Neuro: Cranial nerves II through XII are intact, neurovascularly intact in all extremities with 3+ DTRs and 2+ pulses.  Lymph: No lymphadenopathy of posterior or anterior cervical chain or axillae bilaterally.  Gait normal with good balance and coordination.  MSK:  Non tender with full range of motion and good stability and symmetric strength and tone of shoulders, elbows, wrist, hip, knee bilaterally.  Back Exam:  Inspection: Loss of lordosis Motion: Flexion 25 deg, Extension 15 deg, Side Bending to 15 deg bilaterally,  Rotation to 20 deg bilaterally  SLR laying: Test left greater than right but really bilateral XSLR laying: Negative  Palpable tenderness: Tender to palpation in the paraspinal musculature lumbar spine.Marland Kitchen. FABER: Tightness left greater than right mild positive pain over the left side.. Sensory change: Gross sensation intact to all lumbar and sacral dermatomes.  Reflexes: 2+ at both patellar tendons, 2+ at achilles tendons, Babinski's downgoing.  Strength at foot  4 out of 5 which is weaker than previous exams.   Left ankle shows the patient does have swelling on the lateral  aspect of the ankle.  Patient does have bruising noted there as well.  Full range of motion.  Pain over the ATFL.  No pain over the medial or lateral malleolus. Lateral ankle unremarkable   Impression and Recommendations:     This case required medical decision making of moderate complexity.      Note: This dictation was prepared with Dragon dictation along with smaller phrase technology. Any transcriptional errors that result from this process are unintentional.

## 2017-10-04 NOTE — Assessment & Plan Note (Signed)
Patient does have a sprain of the ATFL but appears to be intact.  Negative anterior drawer test.  We discussed icing regimen and home exercises.  Patient neurovascular intact distally.  Full strength.  Given a brace that I think will be beneficial.  Follow-up again 3-4 weeks

## 2017-10-04 NOTE — Assessment & Plan Note (Signed)
Patient did have more of a lumbar radiculopathy.  We discussed icing which exercises.  We discussed that because of patient having worsening symptoms and never completely pain-free and due to the duration of this.  I am concerned that there could be more of a lumbar radiculopathy.  Possible congenital stenosis.  Positive straight leg test bilaterally.  Given gabapentin for now for breakthrough pain.  Is on given in case patient is not being able to get the MRI initially.  Patient knows if worsening symptoms any weakness or continuous numbness of the legs to seek medical attention immediately.  Patient will call me if any worsening symptoms.  Follow-up again after MRI

## 2017-10-04 NOTE — Telephone Encounter (Signed)
Copied from CRM [phone]. Topic: Inquiry >> Oct 04, 2017  9:07 AM Anice PaganiniMunoz, Brinnley Lacap I, NT wrote: Reason for CRM: Pt Mom Call and said her Daughter have a low back pain it so bad she can not get out of Bed,she would lake to see  Doctor Katrinka Blazingsmith a Soon it possible she schedule for Thu @ 115 PM she would lake being see today.if is any cx call pt back.Thanks

## 2017-10-04 NOTE — Patient Instructions (Addendum)
Good to see you  Lets see what the MRI shows Gabapentin 200mg  at night Prednisone daily for 5 days Call [phone] to schedule the MRI if not heard in a couple days See me again after MRI or sign up for my chart and I cna write you

## 2017-10-05 ENCOUNTER — Telehealth: Payer: Self-pay | Admitting: Family Medicine

## 2017-10-05 NOTE — Telephone Encounter (Signed)
She can take the gabapentin at night and the prednisone.  After done with the prednisone then she can take ibuprofen again if needed.

## 2017-10-05 NOTE — Telephone Encounter (Signed)
Copied from CRM [id number]. Topic: General - Other >> Oct 05, 2017 12:06 PM Percival SpanishKennedy, Cheryl W wrote:  Mom call to say pt is taking  predniSONE (DELTASONE) 50 MG tablet and was told  not to take ibuprofen and want to know what she can take    [facility]   [phone]

## 2017-10-07 ENCOUNTER — Encounter: Payer: Self-pay | Admitting: Family Medicine

## 2017-10-07 ENCOUNTER — Ambulatory Visit: Payer: PRIVATE HEALTH INSURANCE | Admitting: Family Medicine

## 2017-11-01 NOTE — Progress Notes (Signed)
Tawana ScaleZach Priscillia Fouch D.O. [facility] [address] [place], [place] [address] Phone: [phone] Subjective:     CC: Back pain follow-up  BJY:[id number]:Subjective  Brandy Ananias PilgrimVaughan is a 22 y.o. female coming in with complaint of low back pain.  Patient states that she is in less pain than she was when she first came in. Most of her pain is in the morning and she complains of stiffness mostly.  Failed all other conservative therapy including formal physical therapy.  No longer taking vitamins at this point.    She did have an MRI of her spine October 24, 2017.  Plan have a bulging posterior disc at L4-L5 with mass-effect upon the descending L5 nerve roots bilaterally.  Patient also had a bulging disc at L5-S1 with slight nerve compression noted left greater than right of the S1 nerve roots.  Past Medical History:  Diagnosis Date  . Allergy    Past Surgical History:  Procedure Laterality Date  . ANTERIOR CRUCIATE LIGAMENT REPAIR    . MENISCUS REPAIR    . WISDOM TOOTH EXTRACTION     Social History   Socioeconomic History  . Marital status: Single    Spouse name: Not on file  . Number of children: Not on file  . Years of education: Not on file  . Highest education level: Not on file  Social Needs  . Financial resource strain: Not on file  . Food insecurity - worry: Not on file  . Food insecurity - inability: Not on file  . Transportation needs - medical: Not on file  . Transportation needs - non-medical: Not on file  Occupational History  . Not on file  Tobacco Use  . Smoking status: Never Smoker  . Smokeless tobacco: Never Used  Substance and Sexual Activity  . Alcohol use: Not on file  . Drug use: Not on file  . Sexual activity: Not on file  Other Topics Concern  . Not on file  Social History Narrative  . Not on file   Not on File Family History  Problem Relation Age of Onset  . Hypertension Mother      Past medical history, social, surgical and family history all  reviewed in electronic medical record.  No pertanent information unless stated regarding to the chief complaint.   Review of Systems:Review of systems updated and as accurate as of 11/01/17  No headache, visual changes, nausea, vomiting, diarrhea, constipation, dizziness, abdominal pain, skin rash, fevers, chills, night sweats, weight loss, swollen lymph nodes, body aches, joint swelling, muscle aches, chest pain, shortness of breath, mood changes.   Objective  There were no vitals taken for this visit. Systems examined below as of 11/01/17   General: No apparent distress alert and oriented x3 mood and affect normal, dressed appropriately.  HEENT: Pupils equal, extraocular movements intact  Respiratory: Patient's speak in full sentences and does not appear short of breath  Cardiovascular: No lower extremity edema, non tender, no erythema  Skin: Warm dry intact with no signs of infection or rash on extremities or on axial skeleton.  Abdomen: Soft nontender  Neuro: Cranial nerves II through XII are intact, neurovascularly intact in all extremities with 2+ DTRs and 2+ pulses.  Lymph: No lymphadenopathy of posterior or anterior cervical chain or axillae bilaterally.  Gait normal with good balance and coordination.  MSK:  Non tender with full range of motion and good stability and symmetric strength and tone of shoulders, elbows, wrist, hip, knee and ankles bilaterally.  Back Exam:  Inspection: Mild loss of lordosis Motion: Flexion 30 deg, Extension 25 deg, Side Bending to 35 deg bilaterally,  Rotation to 35 deg bilaterally  SLR laying: Negative  XSLR laying: Negative  Palpable tenderness: Tender to palpation the paraspinal musculature lumbar spine.Marland Kitchen FABER: Positive Faber.  Right-sided Sensory change: Gross sensation intact to all lumbar and sacral dermatomes.  Reflexes: 2+ at both patellar tendons, 2+ at achilles tendons, Babinski's downgoing.  Strength at foot  Plantar-flexion: 5/5  Dorsi-flexion: 5/5 Eversion: 5/5 Inversion: 5/5  Leg strength  Quad: 5/5 Hamstring: 5/5 Hip flexor: 5/5 Hip abductors: 4/5 but symmetric Gait unremarkable.   Impression and Recommendations:     This case required medical decision making of moderate complexity.      Note: This dictation was prepared with Dragon dictation along with smaller phrase technology. Any transcriptional errors that result from this process are unintentional.

## 2017-11-02 ENCOUNTER — Encounter: Payer: Self-pay | Admitting: Family Medicine

## 2017-11-02 ENCOUNTER — Ambulatory Visit: Payer: PRIVATE HEALTH INSURANCE | Admitting: Family Medicine

## 2017-11-02 DIAGNOSIS — M5416 Radiculopathy, lumbar region: Secondary | ICD-10-CM | POA: Diagnosis not present

## 2017-11-02 NOTE — Assessment & Plan Note (Signed)
Patient does have lumbar radiculopathy.  Encourage patient continue the gabapentin at this point.  We discussed repeating formal physical therapy and patient will consider.  We also discussed the possibility of nerve root injections as well as epidurals.  We discussed even surgical intervention is being a possible treatment.  Patient wants to consider all of these and will get back to me for further discussion.  Patient knows we can restart osteopathic manipulation as well if needed.  Patient will make appointment in 6 weeks to further evaluate.  Spent  25 minutes with patient face-to-face and had greater than 50% of counseling including as described above in assessment and plan.

## 2017-11-02 NOTE — Patient Instructions (Signed)
Good to see you  2 protruding discs.  Likely pushing on some nerves Vitamin D 4000-5000 IU daily  Turmeric 500mg  twice daily  OK to lift but start with machine weights Still low impact cardio exercises Stay well hydrated Consider either nerve root injections or epidurals Acupuncture is worth a try  See me again in 6 weeks if you have questions.

## 2017-12-20 ENCOUNTER — Ambulatory Visit: Payer: PRIVATE HEALTH INSURANCE | Admitting: Family Medicine

## 2019-09-12 ENCOUNTER — Ambulatory Visit: Payer: PRIVATE HEALTH INSURANCE | Attending: Internal Medicine

## 2019-09-12 DIAGNOSIS — Z20822 Contact with and (suspected) exposure to covid-19: Secondary | ICD-10-CM

## 2019-09-14 LAB — NOVEL CORONAVIRUS, NAA: SARS-CoV-2, NAA: NOT DETECTED

## 2020-07-18 ENCOUNTER — Telehealth: Payer: Self-pay

## 2020-07-18 NOTE — Telephone Encounter (Signed)
Patients mother calling stating she spoke with you during her aunts visit [name] and she said she discussed with you about her Brandy Lee and her daughter Brandy Lee becoming new patient of yours, and wanted to confirm or deny this with you. Thank you

## 2020-07-18 NOTE — Telephone Encounter (Signed)
Yes ok 

## 2020-08-13 ENCOUNTER — Ambulatory Visit: Payer: PRIVATE HEALTH INSURANCE | Admitting: Internal Medicine

## 2020-09-05 NOTE — Progress Notes (Signed)
Subjective:    Patient ID: Brandy Lee, female    DOB: 1996/06/13, 24 y.o.   MRN: [id number]   This visit occurred during the SARS-CoV-2 public health emergency.  Safety protocols were in place, including screening questions prior to the visit, additional usage of staff PPE, and extensive cleaning of exam room while observing appropriate contact time as indicated for disinfecting solutions.    HPI  She is here to establish with a new pcp.  She is here for follow-up of her chronic medical problems.   Right lower back pain - She has been having back pain.  Her brother (  PT) thinks it is here SI. It started one month ago.  It hurts during the day when she is sitting.  Lifting weight increases the pain. She has used a TENs unit and taken ibuprofen (helps temporarily)  and ice it.   It is the right lower back. It occasional radiates to the hip.    She does a lot of weight lifting and has backed off of some of the heavier weights and leg exercises because of her right lower back pain.      Medications and allergies reviewed with patient and updated if appropriate.  Patient Active Problem List   Diagnosis Date Noted   Lumbar radiculopathy 10/04/2017   Left ankle sprain 10/04/2017   Patellofemoral arthralgia of right knee 02/03/2017   Nonallopathic lesion of lumbosacral region 12/30/2016   Nonallopathic lesion of sacral region 12/30/2016   Nonallopathic lesion of thoracic region 12/30/2016   Lumbar stress fracture 12/09/2016   DERMATOPHYTOSIS OF NAIL 03/14/2009   KNEE PAIN, RIGHT 03/14/2009   PAIN IN JOINT, ANKLE AND FOOT 03/14/2009    Current Outpatient Medications on File Prior to Visit  Medication Sig Dispense Refill   Cholecalciferol 25 MCG (1000 UT) tablet Take by mouth.     norgestimate-ethinyl estradiol (ORTHO-CYCLEN,SPRINTEC,PREVIFEM) 0.25-35 MG-MCG tablet Take 1 tablet by mouth daily.     spironolactone (ALDACTONE) 100 MG tablet Take 100 mg by mouth daily.      No current facility-administered medications on file prior to visit.    Past Medical History:  Diagnosis Date   Allergy     Past Surgical History:  Procedure Laterality Date   ANTERIOR CRUCIATE LIGAMENT REPAIR     MENISCUS REPAIR     WISDOM TOOTH EXTRACTION      Social History   Socioeconomic History   Marital status: Single    Spouse name: Not on file   Number of children: Not on file   Years of education: Not on file   Highest education level: Not on file  Occupational History   Not on file  Tobacco Use   Smoking status: Never Smoker   Smokeless tobacco: Never Used  Substance and Sexual Activity   Alcohol use: Not on file   Drug use: Not on file   Sexual activity: Not on file  Other Topics Concern   Not on file  Social History Narrative   Not on file   Social Determinants of Health   Financial Resource Strain: Not on file  Food Insecurity: Not on file  Transportation Needs: Not on file  Physical Activity: Not on file  Stress: Not on file  Social Connections: Not on file    Family History  Problem Relation Age of Onset   Hypertension Mother     Review of Systems  Constitutional: Negative for fever.  Respiratory: Negative for cough, shortness of breath and wheezing.  Cardiovascular: Negative for chest pain, palpitations and leg swelling.  Gastrointestinal: Positive for diarrhea (intermittent). Negative for abdominal pain, blood in stool, constipation and nausea.       No gerd  Genitourinary: Negative for dysuria.  Musculoskeletal: Positive for back pain. Negative for arthralgias.  Neurological: Positive for headaches (hormonal, weather related). Negative for dizziness and light-headedness.  Psychiatric/Behavioral: Negative for decreased concentration and sleep disturbance. The patient is not nervous/anxious.        Objective:   Vitals:   09/06/20 0756  BP: 112/72  Pulse: 75  Temp: 98 F (36.7 C)  SpO2: 98%   Filed  Weights   09/06/20 0756  Weight: 200 lb (90.7 kg)   Body mass index is 33.28 kg/m.  BP Readings from Last 3 Encounters:  09/06/20 112/72  11/02/17 138/88  10/04/17 122/90    Wt Readings from Last 3 Encounters:  09/06/20 200 lb (90.7 kg)  11/02/17 198 lb (89.8 kg)  10/04/17 198 lb (89.8 kg)     Physical Exam Constitutional: She appears well-developed and well-nourished. No distress.  HENT:  Head: Normocephalic and atraumatic.  Right Ear: External ear normal. Normal ear canal and TM Left Ear: External ear normal.  Normal ear canal and TM Mouth/Throat: Oropharynx is clear and moist.  Eyes: Conjunctivae and EOM are normal.  Neck: Neck supple. No tracheal deviation present. No thyromegaly present.  No carotid bruit  Cardiovascular: Normal rate, regular rhythm and normal heart sounds.   No murmur heard.  No edema. Pulmonary/Chest: Effort normal and breath sounds normal. No respiratory distress. She has no wheezes. She has no rales.  Abdominal: Soft. She exhibits no distension. There is no tenderness. Musculoskeletal: No lumbar spine pain or SI joint pain-pain is more in the right buttock going towards the right hip Lymphadenopathy: She has no cervical adenopathy.  Skin: Skin is warm and dry. She is not diaphoretic.  Psychiatric: She has a normal mood and affect. Her behavior is normal.        Assessment & Plan:   Healthy maintenance: Screening blood work    ordered Immunizations up-to-date Gyn   has seen GYN in the past-needs to reestablish with a new gynecologist Exercise regular-walking dogs, weights Weight she is working on weight loss Substance abuse none      See Problem List for Assessment and Plan of chronic medical problems.

## 2020-09-06 ENCOUNTER — Encounter: Payer: Self-pay | Admitting: Internal Medicine

## 2020-09-06 ENCOUNTER — Ambulatory Visit (INDEPENDENT_AMBULATORY_CARE_PROVIDER_SITE_OTHER): Payer: 59 | Admitting: Internal Medicine

## 2020-09-06 ENCOUNTER — Other Ambulatory Visit: Payer: Self-pay

## 2020-09-06 VITALS — BP 112/72 | HR 75 | Temp 98.0°F | Ht 65.0 in | Wt 200.0 lb

## 2020-09-06 DIAGNOSIS — L700 Acne vulgaris: Secondary | ICD-10-CM

## 2020-09-06 DIAGNOSIS — G5701 Lesion of sciatic nerve, right lower limb: Secondary | ICD-10-CM | POA: Diagnosis not present

## 2020-09-06 DIAGNOSIS — E282 Polycystic ovarian syndrome: Secondary | ICD-10-CM | POA: Diagnosis not present

## 2020-09-06 DIAGNOSIS — L709 Acne, unspecified: Secondary | ICD-10-CM | POA: Insufficient documentation

## 2020-09-06 MED ORDER — SPIRONOLACTONE 100 MG PO TABS: 100.0000 mg | ORAL_TABLET | Freq: Every day | ORAL | 3 refills | Status: DC

## 2020-09-06 MED ORDER — NORGESTIMATE-ETH ESTRADIOL 0.25-35 MG-MCG PO TABS: 1.0000 | ORAL_TABLET | Freq: Every day | ORAL | 3 refills | Status: DC

## 2020-09-06 NOTE — Assessment & Plan Note (Signed)
Acute States right lower back pain, but seems to be more focused in the right buttock and extending to the hip-possible piriformis syndrome Will refer to sports medicine for further evaluation and treatment Continue stretching and revising activities

## 2020-09-06 NOTE — Assessment & Plan Note (Signed)
Chronic Taking Ortho-Cyclen-we will continue-prescription given for 1 year She will establish with a new gynecologist-I did give her some names of practices to consider

## 2020-09-06 NOTE — Assessment & Plan Note (Signed)
Chronic Currently taking spironolactone 100 mg daily, which is effective We will continue at current dose-prescription given for 1 year

## 2020-09-06 NOTE — Patient Instructions (Addendum)
  See Dr Katrinka Blazing for your R buttock pain - ? piriformis       [facility] [address] [place], [place] [address] [phone]   [facility] [address] [place], [place] [address] Across the street from [facility] [phone]

## 2020-09-19 NOTE — Progress Notes (Deleted)
[name] [address] [place] [address] Phone: [phone] Subjective:    I'm seeing this patient by the request  of:  Burns, Bobette Mo, MD  CC:   IOE:[id number]  Anaissa Person is a 24 y.o. female coming in with complaint of right piriformis syndrome. Last seen in 2019 for LBP due to stress fracture of vertebrae.       Past Medical History:  Diagnosis Date  . Allergy    Past Surgical History:  Procedure Laterality Date  . ANTERIOR CRUCIATE LIGAMENT REPAIR    . MENISCUS REPAIR    . WISDOM TOOTH EXTRACTION     Social History   Socioeconomic History  . Marital status: Single    Spouse name: Not on file  . Number of children: Not on file  . Years of education: Not on file  . Highest education level: Not on file  Occupational History  . Not on file  Tobacco Use  . Smoking status: Never Smoker  . Smokeless tobacco: Never Used  Substance and Sexual Activity  . Alcohol use: Not on file  . Drug use: Not on file  . Sexual activity: Not on file  Other Topics Concern  . Not on file  Social History Narrative  . Not on file   Social Determinants of Health   Financial Resource Strain: Not on file  Food Insecurity: Not on file  Transportation Needs: Not on file  Physical Activity: Not on file  Stress: Not on file  Social Connections: Not on file   Not on File Family History  Problem Relation Age of Onset  . Hypertension Mother   . Hyperlipidemia Mother     Current Outpatient Medications (Endocrine & Metabolic):  .  norgestimate-ethinyl estradiol (ORTHO-CYCLEN) 0.25-35 MG-MCG tablet, Take 1 tablet by mouth daily.  Current Outpatient Medications (Cardiovascular):  .  spironolactone (ALDACTONE) 100 MG tablet, Take 1 tablet (100 mg total) by mouth daily.     Current Outpatient Medications (Other):  Marland Kitchen  Cholecalciferol 25 MCG (1000 UT) tablet, Take by mouth. .  tretinoin (RETIN-A) 0.01 % gel, Apply topically  daily.   Reviewed prior external information including notes and imaging from  primary care provider As well as notes that were available from care everywhere and other healthcare systems.  Past medical history, social, surgical and family history all reviewed in electronic medical record.  No pertanent information unless stated regarding to the chief complaint.   Review of Systems:  No headache, visual changes, nausea, vomiting, diarrhea, constipation, dizziness, abdominal pain, skin rash, fevers, chills, night sweats, weight loss, swollen lymph nodes, body aches, joint swelling, chest pain, shortness of breath, mood changes. POSITIVE muscle aches  Objective  There were no vitals taken for this visit.   General: No apparent distress alert and oriented x3 mood and affect normal, dressed appropriately.  HEENT: Pupils equal, extraocular movements intact  Respiratory: Patient's speak in full sentences and does not appear short of breath  Cardiovascular: No lower extremity edema, non tender, no erythema  Neuro: Cranial nerves II through XII are intact, neurovascularly intact in all extremities with 2+ DTRs and 2+ pulses.  Gait normal with good balance and coordination.  MSK:  Non tender with full range of motion and good stability and symmetric strength and tone of shoulders, elbows, wrist, hip, knee and ankles bilaterally.     Impression and Recommendations:     The above documentation has been reviewed and is accurate and complete Vikki Ports  [employer]

## 2020-09-24 ENCOUNTER — Ambulatory Visit: Payer: 59 | Admitting: Family Medicine

## 2020-10-26 ENCOUNTER — Encounter: Payer: Self-pay | Admitting: Internal Medicine

## 2020-10-28 NOTE — Progress Notes (Signed)
Virtual Visit via Video Note  I connected with Brandy Lee on 10/28/20 at 11:15 AM EST by a video enabled telemedicine application and verified that I am speaking with the correct person using two identifiers.   I discussed the limitations of evaluation and management by telemedicine and the availability of in person appointments. The patient expressed understanding and agreed to proceed.  Present for the visit:  Myself, Dr Cheryll Cockayne, Brandy Lee.  The patient is currently at home and I am in the office.    No referring provider.    History of Present Illness: This is an acute visit for stye   She started to have a stye in her left lower medial eyelid 5 days ago.  She has been applying warm compresses and that has helped.  She is also recently started using erythromycin ointment.  The stye has drained a little bit and at one point she had agreed mucus coming out of her tear duct.  She thinks it is approximately half the size as it was and it is a little bit drier now.  She still feels like those an infection in it.  Her eye itches and is irritated, but she denies any pain or changes in vision.   Social History   Socioeconomic History  . Marital status: Single    Spouse name: Not on file  . Number of children: Not on file  . Years of education: Not on file  . Highest education level: Not on file  Occupational History  . Not on file  Tobacco Use  . Smoking status: Never Smoker  . Smokeless tobacco: Never Used  Substance and Sexual Activity  . Alcohol use: Not on file  . Drug use: Not on file  . Sexual activity: Not on file  Other Topics Concern  . Not on file  Social History Narrative  . Not on file   Social Determinants of Health   Financial Resource Strain: Not on file  Food Insecurity: Not on file  Transportation Needs: Not on file  Physical Activity: Not on file  Stress: Not on file  Social Connections: Not on file     Observations/Objective: Appears well in  NAD Visible stye left lower medial aspect of eyelid-because of poor video quality difficult to see in good detail  Assessment and Plan:  See Problem List for Assessment and Plan of chronic medical problems.   Follow Up Instructions:    I discussed the assessment and treatment plan with the patient. The patient was provided an opportunity to ask questions and all were answered. The patient agreed with the plan and demonstrated an understanding of the instructions.   The patient was advised to call back or seek an in-person evaluation if the symptoms worsen or if the condition fails to improve as anticipated.    Pincus Sanes, MD

## 2020-10-29 ENCOUNTER — Encounter: Payer: Self-pay | Admitting: Internal Medicine

## 2020-10-29 ENCOUNTER — Telehealth (INDEPENDENT_AMBULATORY_CARE_PROVIDER_SITE_OTHER): Payer: 59 | Admitting: Internal Medicine

## 2020-10-29 DIAGNOSIS — H00015 Hordeolum externum left lower eyelid: Secondary | ICD-10-CM

## 2020-10-29 DIAGNOSIS — H00019 Hordeolum externum unspecified eye, unspecified eyelid: Secondary | ICD-10-CM | POA: Insufficient documentation

## 2020-10-29 MED ORDER — CEPHALEXIN 500 MG PO CAPS: 500.0000 mg | ORAL_CAPSULE | Freq: Two times a day (BID) | ORAL | 0 refills | Status: DC

## 2020-10-29 NOTE — Assessment & Plan Note (Signed)
Acute Started 5 days ago-there has been some improvement, but still has a good size stye Continue warm compresses Continue with erythromycin ointment We will start Keflex 500 mg twice daily x7 days

## 2020-10-30 ENCOUNTER — Telehealth: Payer: 59 | Admitting: Internal Medicine

## 2021-03-21 ENCOUNTER — Encounter: Payer: Self-pay | Admitting: Internal Medicine

## 2021-10-20 ENCOUNTER — Other Ambulatory Visit: Payer: Self-pay | Admitting: Internal Medicine

## 2021-11-12 ENCOUNTER — Other Ambulatory Visit: Payer: Self-pay | Admitting: Internal Medicine

## 2021-12-08 ENCOUNTER — Other Ambulatory Visit: Payer: Self-pay | Admitting: Internal Medicine

## 2021-12-21 ENCOUNTER — Other Ambulatory Visit: Payer: Self-pay | Admitting: Internal Medicine

## 2022-01-23 ENCOUNTER — Ambulatory Visit (INDEPENDENT_AMBULATORY_CARE_PROVIDER_SITE_OTHER): Payer: 59 | Admitting: Family Medicine

## 2022-01-23 ENCOUNTER — Encounter: Payer: Self-pay | Admitting: Family Medicine

## 2022-01-23 ENCOUNTER — Ambulatory Visit (INDEPENDENT_AMBULATORY_CARE_PROVIDER_SITE_OTHER): Payer: 59

## 2022-01-23 ENCOUNTER — Telehealth: Payer: Self-pay | Admitting: Internal Medicine

## 2022-01-23 VITALS — BP 112/78 | HR 70 | Temp 97.8°F | Ht 65.0 in

## 2022-01-23 DIAGNOSIS — M545 Low back pain, unspecified: Secondary | ICD-10-CM | POA: Diagnosis not present

## 2022-01-23 DIAGNOSIS — G8911 Acute pain due to trauma: Secondary | ICD-10-CM

## 2022-01-23 DIAGNOSIS — M25512 Pain in left shoulder: Secondary | ICD-10-CM | POA: Diagnosis not present

## 2022-01-23 DIAGNOSIS — S99911A Unspecified injury of right ankle, initial encounter: Secondary | ICD-10-CM

## 2022-01-23 DIAGNOSIS — S80212A Abrasion, left knee, initial encounter: Secondary | ICD-10-CM | POA: Diagnosis not present

## 2022-01-23 DIAGNOSIS — W19XXXA Unspecified fall, initial encounter: Secondary | ICD-10-CM | POA: Diagnosis not present

## 2022-01-23 NOTE — Telephone Encounter (Signed)
Pt's mother wants me to tell you that pt fell off a step ladder and is having px in her lower back and from her R ankle up her rRcalve. She just really wanted me to let you know.  ?

## 2022-01-23 NOTE — Progress Notes (Signed)
? ?Subjective:  ? ? ? Patient ID: Brandy Lee, female    DOB: 02/10/1996, 26 y.o.   MRN: [id number] ? ?Chief Complaint  ?Patient presents with  ? Fall  ?  Fell off porch (about 4 feet) about an hour ago. Braced herself with her left side but forced her onto her back and right side. Landed on half concrete half ground.  Pain in lower right leg starting from ankle radiating up to shin also notes lower back pain. Has been using ice.  ? ? ?HPI ?Patient is in today for a fall approximately one hour ago. Her mother is with her and witnessed her fall. States she was on a step ladder and she was getting down, the wood support on her porch gave away and she fell approximately 4 feet onto a concrete landing.  ?Feel onto her left side and then her back was half on the concrete and half on the dirt.  ?Denies hitting her head or LOC. Denies neck pain.  ? ?Complains of left shoulder pain, mainly anterior, right low back pain, right ankle pain and has an abrasion on her left knee.  ?She is able to walk and move all extremities but right ankle pain is worse with walking.  ? ?Tdap is UTD ? ?Denies fever, chills, dizziness, headache, vision changes, chest pain, palpitations, shortness of breath, abdominal pain, vomiting or diarrhea.  ? ?She does report nausea which is usual for her when she has pain.  ? ?  ? ? ?Health Maintenance Due  ?Topic Date Due  ? HPV VACCINES (1 - 2-dose series) Never done  ? HIV Screening  Never done  ? Hepatitis C Screening  Never done  ? PAP-Cervical Cytology Screening  Never done  ? PAP SMEAR-Modifier  Never done  ? ? ?Past Medical History:  ?Diagnosis Date  ? Allergy   ? ? ?Past Surgical History:  ?Procedure Laterality Date  ? ANTERIOR CRUCIATE LIGAMENT REPAIR    ? MENISCUS REPAIR    ? WISDOM TOOTH EXTRACTION    ? ? ?Family History  ?Problem Relation Age of Onset  ? Hypertension Mother   ? Hyperlipidemia Mother   ? ? ?Social History  ? ?Socioeconomic History  ? Marital status: Single  ?  Spouse name: Not  on file  ? Number of children: Not on file  ? Years of education: Not on file  ? Highest education level: Not on file  ?Occupational History  ? Not on file  ?Tobacco Use  ? Smoking status: Never  ? Smokeless tobacco: Never  ?Substance and Sexual Activity  ? Alcohol use: Not on file  ? Drug use: Not on file  ? Sexual activity: Not on file  ?Other Topics Concern  ? Not on file  ?Social History Narrative  ? Not on file  ? ?Social Determinants of Health  ? ?Financial Resource Strain: Not on file  ?Food Insecurity: Not on file  ?Transportation Needs: Not on file  ?Physical Activity: Not on file  ?Stress: Not on file  ?Social Connections: Not on file  ?Intimate Partner Violence: Not on file  ? ? ?Outpatient Medications Prior to Visit  ?Medication Sig Dispense Refill  ? Cholecalciferol 25 MCG (1000 UT) tablet Take by mouth.    ? norgestimate-ethinyl estradiol (ORTHO-CYCLEN) 0.25-35 MG-MCG tablet TAKE 1 TABLET BY MOUTH EVERY DAY 84 tablet 3  ? spironolactone (ALDACTONE) 100 MG tablet TAKE 1 TABLET BY MOUTH EVERY DAY 30 tablet 1  ? tretinoin (RETIN-A) 0.01 %  gel Apply topically daily.    ? cephALEXin (KEFLEX) 500 MG capsule Take 1 capsule (500 mg total) by mouth 2 (two) times daily. (Patient not taking: Reported on 01/23/2022) 14 capsule 0  ? ?No facility-administered medications prior to visit.  ? ? ?Not on File ? ?ROS ?Pertinent positives and negatives in the history of present illness. ? ?   ?Objective:  ?  ?Physical Exam ?Constitutional:   ?   General: She is not in acute distress. ?   Appearance: She is not ill-appearing.  ?HENT:  ?   Head: Atraumatic.  ?Eyes:  ?   Extraocular Movements: Extraocular movements intact.  ?   Conjunctiva/sclera: Conjunctivae normal.  ?   Pupils: Pupils are equal, round, and reactive to light.  ?Cardiovascular:  ?   Rate and Rhythm: Normal rate.  ?   Pulses: Normal pulses.  ?Pulmonary:  ?   Effort: Pulmonary effort is normal.  ?Abdominal:  ?   General: Abdomen is flat. There is no distension.   ?   Palpations: Abdomen is soft.  ?   Tenderness: There is no abdominal tenderness.  ?Musculoskeletal:  ?   Left shoulder: Tenderness present. No deformity. Normal strength. Normal pulse.  ?   Right elbow: Normal.  ?   Left elbow: Normal.  ?   Right wrist: Normal.  ?   Left wrist: Normal.  ?   Right hand: Normal.  ?   Left hand: Normal.  ?   Cervical back: Normal, normal range of motion and neck supple.  ?   Lumbar back: Tenderness present.  ?   Right hip: Normal range of motion.  ?   Left hip: Normal range of motion.  ?   Left ankle: Swelling present. Tenderness present. Normal pulse.  ?   Comments: Left anterior shoulder with TTP along bicipital groove ?Lumbar spine TTP without obvious step off, she also has tenderness along right paraspinal muscles.  ?Abrasion of left anterior knee ?Right ankle -pain with movement but is able to move ankle in all planes ?Bilateral LE are neurovascularly intact   ?Skin: ?   General: Skin is warm and dry.  ?   Capillary Refill: Capillary refill takes less than 2 seconds.  ?Neurological:  ?   General: No focal deficit present.  ?   Mental Status: She is alert and oriented to person, place, and time.  ?   Gait: Gait abnormal.  ?Psychiatric:     ?   Mood and Affect: Mood normal.     ?   Behavior: Behavior normal.     ?   Thought Content: Thought content normal.  ? ? ?BP 112/78 (BP Location: Left Arm, Patient Position: Sitting, Cuff Size: Large)   Pulse 70   Temp 97.8 ?F (36.6 ?C) (Temporal)   Ht 5\' 5"  (1.651 m)   LMP 01/23/2022   SpO2 95%   BMI 33.28 kg/m?  ?Wt Readings from Last 3 Encounters:  ?09/06/20 200 lb (90.7 kg)  ?11/02/17 198 lb (89.8 kg)  ?10/04/17 198 lb (89.8 kg)  ? ? ? ?   ?Assessment & Plan:  ? ?Problem List Items Addressed This Visit   ?None ?Visit Diagnoses   ? ? Fall, initial encounter    -  Primary  ? Relevant Orders  ? DG Lumbar Spine Complete  ? DG Ankle Complete Right  ? Acute bilateral low back pain without sciatica      ? Relevant Orders  ? DG Lumbar  Spine Complete  ?  Acute pain of left shoulder due to trauma      ? Abrasion of left knee, initial encounter      ? Injury of right ankle, initial encounter      ? Relevant Orders  ? DG Ankle Complete Right  ? ?  ? ?She is here today with her mother with acute pain of her left shoulder, lumbar spine, right low back, and right ankle approximately 1 hour post mechanical fall from approximately 4 feet. she did not hit her head did not lose consciousness.  She is not having neck pain. ?No red flag symptoms. ?X-rays ordered. ?Discussed conservative treatment with ibuprofen 600 to 800 mg 3 times a day and Tylenol 1000 mg twice daily.  Discussed using ice or heat and topical analgesic as needed.  Follow-up pending x-ray results or if she is worsening.  She also sees Dr. Tamala Julian in sports medicine and may follow-up with him as well. ? ?I have discontinued Madisan Ishee's cephALEXin. I am also having her maintain her Cholecalciferol, tretinoin, norgestimate-ethinyl estradiol, and spironolactone. ? ?No orders of the defined types were placed in this encounter. ? ? ?

## 2022-01-23 NOTE — Telephone Encounter (Signed)
Patient seen today in office with Vickie. ?

## 2022-01-23 NOTE — Patient Instructions (Signed)
Go downstairs for X rays before you leave today.  ? ?Rest, ice and elevate your ankle. Ice your low back.  ?Take Advil 600 or 800 mg every 8 hours with food and you may also take Tylenol 1,000 mg twice daily for the next 3-4 days.  ? ?Follow up if you are worsening with either Dr. Katrinka Blazing or here.  ? ?We will be in touch with your results.  ?

## 2022-01-23 NOTE — Telephone Encounter (Signed)
She should of course come in if needs to be evaluated further  ?

## 2022-01-26 NOTE — Progress Notes (Signed)
? ? ?Subjective:  ? ? Patient ID: Brandy Lee, female    DOB: 04/14/1996, 26 y.o.   MRN: [id number] ? ? ? ? ? ?HPI ?[name] is here for  ?Chief Complaint  ?Patient presents with  ? Fall  ?  Fall this past Friday; Patient reports back spasms in mid to lower back now  ? ? ? ?Fall - she fell last Friday, 4 days ago.  She was on a step ladder and fell 4.5-5 ft.  She thinks she landed on her left side and back.  She was seen by [facility] and had negative right ankle xrays and lumbar xrays. ? ?She has been taking tylenol and advil 600 mg, using heat/ice and doing some walking/stretching.  ? ?She has tightness in her right lower back into her hip.  Sitting causes spasms.  She is going for walks - some days it helps and other days it makes the spasms worse.   ? ?Neck stiffness - she has difficulty turning her head.  It has improved.  ? ?She has headaches in the morning.  Headaches are generally in the back of the head. ? ?Sleep is interrupted and when she first gets up in the morning  she is the most stiff.   ? ?Overall her pain is getting better. ? ? ?She is interested in seeing a nutritionist.  She has been using my fitness pal and feels like she needs more guidance on trying to get enough protein. ? ?Medications and allergies reviewed with patient and updated if appropriate. ? ?Current Outpatient Medications on File Prior to Visit  ?Medication Sig Dispense Refill  ? Cholecalciferol 25 MCG (1000 UT) tablet Take by mouth.    ? norgestimate-ethinyl estradiol (ORTHO-CYCLEN) 0.25-35 MG-MCG tablet TAKE 1 TABLET BY MOUTH EVERY DAY 84 tablet 3  ? spironolactone (ALDACTONE) 100 MG tablet TAKE 1 TABLET BY MOUTH EVERY DAY 30 tablet 1  ? tretinoin (RETIN-A) 0.01 % gel Apply topically daily.    ? ?No current facility-administered medications on file prior to visit.  ? ? ?Review of Systems  ?Respiratory:  Negative for shortness of breath.   ?Cardiovascular:  Negative for chest pain.  ?Musculoskeletal:  Positive for back pain, neck pain and  neck stiffness.  ?Neurological:  Positive for headaches. Negative for dizziness, weakness, light-headedness and numbness.  ? ?   ?Objective:  ? ?Vitals:  ? 01/27/22 1300  ?BP: 124/82  ?Pulse: 76  ?Temp: 97.9 ?F (36.6 ?C)  ?SpO2: 99%  ? ?BP Readings from Last 3 Encounters:  ?01/27/22 124/82  ?01/23/22 112/78  ?09/06/20 112/72  ? ?Wt Readings from Last 3 Encounters:  ?01/27/22 217 lb (98.4 kg)  ?09/06/20 200 lb (90.7 kg)  ?11/02/17 198 lb (89.8 kg)  ? ?Body mass index is 36.11 kg/m?. ? ?  ?Physical Exam ?Constitutional:   ?   Appearance: Normal appearance.  ?HENT:  ?   Head: Normocephalic and atraumatic.  ?Musculoskeletal:  ?   Right lower leg: No edema.  ?   Left lower leg: No edema.  ?   Comments: Bilateral lumbar spine paravertebral, pain increased with movement.  Right side muscle tenderness extends up to mid back.  No lumbar spine tenderness.  No cervical spine tenderness.  Bilateral trapezius muscle tenderness from base of skull to shoulder.  Decreased range of motion of lumbar spine and cervical spine secondary to muscle pain.  ?Skin: ?   General: Skin is warm and dry.  ?Neurological:  ?   Mental Status: She is  alert and oriented to person, place, and time.  ?Psychiatric:     ?   Mood and Affect: Mood normal.  ? ?   ? ? ? ? ? ?Assessment & Plan:  ? ? ?See Problem List for Assessment and Plan of chronic medical problems.  ? ? ? ? ?

## 2022-01-27 ENCOUNTER — Ambulatory Visit (INDEPENDENT_AMBULATORY_CARE_PROVIDER_SITE_OTHER): Payer: 59 | Admitting: Internal Medicine

## 2022-01-27 ENCOUNTER — Encounter: Payer: Self-pay | Admitting: Internal Medicine

## 2022-01-27 VITALS — BP 124/82 | HR 76 | Temp 97.9°F | Ht 65.0 in | Wt 217.0 lb

## 2022-01-27 DIAGNOSIS — M62838 Other muscle spasm: Secondary | ICD-10-CM | POA: Diagnosis not present

## 2022-01-27 DIAGNOSIS — M545 Low back pain, unspecified: Secondary | ICD-10-CM | POA: Insufficient documentation

## 2022-01-27 DIAGNOSIS — E282 Polycystic ovarian syndrome: Secondary | ICD-10-CM | POA: Diagnosis not present

## 2022-01-27 DIAGNOSIS — E669 Obesity, unspecified: Secondary | ICD-10-CM | POA: Diagnosis not present

## 2022-01-27 MED ORDER — MELOXICAM 15 MG PO TABS: 15.0000 mg | ORAL_TABLET | Freq: Every day | ORAL | 0 refills | Status: DC

## 2022-01-27 NOTE — Assessment & Plan Note (Signed)
Nutrition referral ordered

## 2022-01-27 NOTE — Assessment & Plan Note (Signed)
Subacute ?Secondary to fall last week ?Having muscle pain and spasms in her neck ?Continue heat, ice, topical medications, stretching ?Continue Tylenol ?We will change ibuprofen to meloxicam 15 mg daily so it makes it easier and hopefully this will work slightly better than the ibuprofen ?Deferred muscle relaxer for now, but if pain does not improve over the next few days she will let me know if she wants to try 1 ?Deferred steroids ?Her brother is a physical therapist and has given her some exercises to do and she will continue to do them ?

## 2022-01-27 NOTE — Patient Instructions (Addendum)
? ? ? ? ?  Medications changes include :   start meloxicam 15 mg daily with food. ? ? ?Your prescription(s) have been sent to your pharmacy.  ? ? ?A referral was ordered for nutrition.     Someone from that office will call you to schedule an appointment.  ? ? ?Return if symptoms worsen or fail to improve. ? ?

## 2022-01-27 NOTE — Assessment & Plan Note (Signed)
Subacute ?Related to fall last week ?Muscular in nature ?Lumbar x-ray showed no acute fracture/bone injury ?Will change ibuprofen to meloxicam for better coverage and ease of taking it once a day ?Continue Tylenol as needed ?Continue heat, ice, stretching, topical medications ?Deferred muscle relaxer today, but if she changes her mind she will let me know ?Continue PT exercises-can refer to official PT if needed ?

## 2022-01-27 NOTE — Assessment & Plan Note (Signed)
Chronic ?Working on trying to eat healthy-trying to increase her protein intake in a healthy way and overall trying to eat healthy ?Interested in seeing a nutritionist for further help ?Referral ordered for nutrition ?

## 2022-02-23 ENCOUNTER — Other Ambulatory Visit: Payer: Self-pay | Admitting: Internal Medicine

## 2022-03-11 ENCOUNTER — Encounter: Payer: Self-pay | Admitting: Internal Medicine

## 2022-04-17 ENCOUNTER — Ambulatory Visit: Payer: 59 | Admitting: Registered"

## 2022-04-25 ENCOUNTER — Other Ambulatory Visit: Payer: Self-pay | Admitting: Internal Medicine

## 2022-06-19 ENCOUNTER — Ambulatory Visit: Payer: 59 | Admitting: Internal Medicine

## 2022-06-22 ENCOUNTER — Encounter: Payer: Self-pay | Admitting: Internal Medicine
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
## 2022-06-23 ENCOUNTER — Ambulatory Visit (INDEPENDENT_AMBULATORY_CARE_PROVIDER_SITE_OTHER): Payer: 59 | Admitting: Internal Medicine

## 2022-06-23 DIAGNOSIS — F0781 Postconcussional syndrome: Secondary | ICD-10-CM | POA: Diagnosis not present

## 2022-06-23 DIAGNOSIS — R519 Headache, unspecified: Secondary | ICD-10-CM | POA: Diagnosis not present

## 2022-06-23 DIAGNOSIS — R42 Dizziness and giddiness: Secondary | ICD-10-CM

## 2022-06-23 MED ORDER — TRETINOIN 0.01 % EX GEL
Freq: Every day | CUTANEOUS | 3 refills | Status: AC
Start: 2022-06-23 — End: ?

## 2022-06-23 MED ORDER — SPIRONOLACTONE 100 MG PO TABS: 100.0000 mg | ORAL_TABLET | Freq: Every day | ORAL | 3 refills | Status: DC
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
## 2022-06-23 NOTE — Patient Instructions (Addendum)
Concussion, Adult ? ?A concussion is a brain injury from a hard, direct hit (trauma) to the head or body. This direct hit causes the brain to shake quickly back and forth inside the skull. This can damage brain cells and cause chemical changes in the brain. A concussion may also be known as a mild traumatic brain injury (TBI). ?Concussions are usually not life-threatening, but the effects of a concussion can be serious. If you have a concussion, you should be very careful to avoid having a second concussion. ?What are the causes? ?This condition is caused by: ?A direct hit to your head, such as: ?Running into another player during a game. ?Being hit in a fight. ?Hitting your head on a hard surface. ?Sudden movement of your body that causes your brain to move back and forth inside the skull, such as in a car crash. ?What are the signs or symptoms? ?The signs of a concussion can be hard to notice. Early on, they may be missed by you, family members, and health care providers. You may look fine on the outside but may act or feel differently. ?Every head injury is different. Symptoms are usually temporary but may last for days, weeks, or even months. Some symptoms appear right away, but other symptoms may not show up for hours or days. If your symptoms last longer than normal, you may have post-concussion syndrome. ?Physical symptoms ?Headaches. ?Dizziness and problems with coordination or balance. ?Sensitivity to light or noise. ?Nausea or vomiting. ?Tiredness (fatigue). ?Vision or hearing problems. ?Changes in eating or sleeping patterns. ?Seizure. ?Mental and emotional symptoms ?Irritability or mood changes. ?Memory problems. ?Trouble concentrating, organizing, or making decisions. ?Slowness in thinking, acting or reacting, speaking, or reading. ?Anxiety or depression. ?How is this diagnosed? ?This condition is diagnosed based on: ?Your symptoms. ?A description of your injury. ?You may also have tests,  including: ?Imaging tests, such as a CT scan or an MRI. ?Neuropsychological tests. These measure your thinking, understanding, learning, and remembering abilities. ?How is this treated? ?Treatment for this condition includes: ?Stopping sports or activity if you are injured. If you hit your head or show signs of concussion: ?Do not return to sports or activities the same day. ?Get checked by a health care provider before you return to your activities. ?Physical and mental rest and careful observation, usually at home. Gradually return to your normal activities. ?Medicines to help with symptoms such as headaches, nausea, or difficulty sleeping. ?Avoid taking opioid pain medicine while recovering from a concussion. ?Avoiding alcohol and drugs. These may slow your recovery and can put you at risk of further injury. ?Referral to a concussion clinic or rehabilitation center. ?Recovery from a concussion can take time. How fast you recover depends on many factors. Return to activities only when: ?Your symptoms are completely gone. ?Your health care provider says that it is safe. ?Follow these instructions at home: ?Activity ?Limit activities that require a lot of thought or concentration, such as: ?Doing homework or job-related work. ?Watching TV. ?Working on the computer or phone. ?Playing memory games and puzzles. ?Rest. Rest helps your brain heal. Make sure you: ?Get plenty of sleep. Most adults should get 7-9 hours of sleep each night. ?Rest during the day. Take naps or rest breaks when you feel tired. ?Avoid physical activity like exercise until your health care provider says it is safe. Stop any activity that worsens symptoms. ?Do not do high-risk activities that could cause a second concussion, such as riding a bike or playing   sports. ?Ask your health care provider when you can return to your normal activities, such as school, work, athletics, and driving. Your ability to react may be slower after a brain injury.  Never do these activities if you are dizzy. Your health care provider will likely give you a plan for gradually returning to activities. ?General instructions ? ?Take over-the-counter and prescription medicines only as told by your health care provider. Some medicines, such as blood thinners (anticoagulants) and aspirin, may increase the risk for complications, such as bleeding. ?Do not drink alcohol until your health care provider says you can. ?Watch your symptoms and tell others around you to do the same. Complications sometimes occur after a concussion. Older adults with a brain injury may have a higher risk of serious complications. ?Tell your work manager, teachers, school nurse, school counselor, coach, or athletic trainer about your injury, symptoms, and restrictions. ?Keep all follow-up visits as told by your health care provider. This is important. ?How is this prevented? ?Avoiding another brain injury is very important. In rare cases, another injury can lead to permanent brain damage, brain swelling, or death. The risk of this is greatest during the first 7-10 days after a head injury. Avoid injuries by: ?Stopping activities that could lead to a second concussion, such as contact or recreational sports, until your health care provider says it is okay. ?Taking these actions once you have returned to sports or activities: ?Avoiding plays or moves that can cause you to crash into another person. This is how most concussions occur. ?Following the rules and being respectful of other players. Do not engage in violent or illegal plays. ?Getting regular exercise that includes strength and balance training. ?Wearing a properly fitting helmet during sports, biking, or other activities. Helmets can help protect you from serious skull and brain injuries, but they may not protect you from a concussion. Even when wearing a helmet, you should avoid being hit in the head. ?Contact a health care provider if: ?Your  symptoms do not improve. ?You have new symptoms. ?You have another injury. ?Get help right away if: ?You have new or worsening physical symptoms, such as: ?A severe or worsening headache. ?Weakness or numbness in any part of your body, slurred speech, vision changes, or confusion. ?Your coordination gets worse. ?Vomiting repeatedly. ?You have a seizure. ?You have unusual behavior changes. ?You lose consciousness, are sleepier than normal, or are difficult to wake up. ?These symptoms may represent a serious problem that is an emergency. Do not wait to see if the symptoms will go away. Get medical help right away. Call your local emergency services (911 in the [place] Do not drive yourself to the hospital. ?Summary ?A concussion is a brain injury that results from a hard, direct hit (trauma) to your head or body. ?You may have imaging tests and neuropsychological tests to diagnose a concussion. ?Treatment for this condition includes physical and mental rest and careful observation. ?Ask your health care provider when you can return to your normal activities, such as school, work, athletics, and driving. ?Get help right away if you have a severe headache, weakness in any part of the body, seizures, behavior changes, changes in vision, or if you are confused or sleepier than normal. ?This information is not intended to replace advice given to you by your health care provider. Make sure you discuss any questions you have with your health care provider. ?Document Revised: 11/21/2020 Document Reviewed: 11/21/2020 ?Elsevier Patient Education ? 2023 [employer] ? ?

## 2022-09-18 ENCOUNTER — Other Ambulatory Visit: Payer: Self-pay | Admitting: Internal Medicine

## 2022-09-24 ENCOUNTER — Encounter: Payer: Self-pay | Admitting: Internal Medicine

## 2022-09-24 NOTE — Progress Notes (Signed)
    Subjective:    Patient ID: Brandy Lee, female    DOB: 04/03/1996, 27 y.o.   MRN: [id number]      HPI Rian is here for  Chief Complaint  Patient presents with   Recurrent Skin Infections    Noticed Sunday night is painful and has drained some has also grew in size     Infected boil - left buttock - started Sunday night, 5 minutes ago,- progressively got worse.  2 days ago could not sit on the area because of the pain.  It has gotten larger.  She does have a history of infected boils in the past.  She has been doing warm compresses.  It has drained a little and has scabbed up.  She denies any fevers.  She is just getting over COVID.    Medications and allergies reviewed with patient and updated if appropriate.  Current Outpatient Medications on File Prior to Visit  Medication Sig Dispense Refill   Cholecalciferol 25 MCG (1000 UT) tablet Take by mouth.     norgestimate-ethinyl estradiol (ORTHO-CYCLEN) 0.25-35 MG-MCG tablet TAKE 1 TABLET BY MOUTH EVERY DAY 84 tablet 3   spironolactone (ALDACTONE) 100 MG tablet TAKE 1 TABLET BY MOUTH DAILY. 90 tablet 1   tretinoin (RETIN-A) 0.01 % gel Apply topically daily. 45 g 3   meloxicam (MOBIC) 15 MG tablet TAKE 1 TABLET (15 MG TOTAL) BY MOUTH DAILY. (Patient not taking: Reported on 09/25/2022) 30 tablet 0   No current facility-administered medications on file prior to visit.    Review of Systems     Objective:   Vitals:   09/25/22 1608  BP: 122/76  Pulse: 81  Temp: 98.6 F (37 C)  SpO2: 99%   BP Readings from Last 3 Encounters:  09/25/22 122/76  06/23/22 106/70  01/27/22 124/82   Wt Readings from Last 3 Encounters:  09/25/22 216 lb (98 kg)  06/23/22 214 lb (97.1 kg)  01/27/22 217 lb (98.4 kg)   Body mass index is 35.94 kg/m.    Physical Exam Constitutional:      General: She is not in acute distress.    Appearance: Normal appearance. She is not ill-appearing.  Skin:    General: Skin is warm and dry.      Comments: Left lateral buttock with area erythema symmetrically 5 cm in length with central area of fluctuation and small scab.  Area is warm and very tender.  With pressure small amount of pus was able to be extracted  Neurological:     Mental Status: She is alert.            Assessment & Plan:    See Problem List for Assessment and Plan of chronic medical problems.

## 2022-09-25 ENCOUNTER — Ambulatory Visit (INDEPENDENT_AMBULATORY_CARE_PROVIDER_SITE_OTHER): Payer: 59 | Admitting: Internal Medicine

## 2022-09-25 ENCOUNTER — Encounter: Payer: Self-pay | Admitting: Internal Medicine

## 2022-09-25 VITALS — BP 122/76 | HR 81 | Temp 98.6°F | Ht 65.0 in | Wt 216.0 lb

## 2022-09-25 DIAGNOSIS — L089 Local infection of the skin and subcutaneous tissue, unspecified: Secondary | ICD-10-CM | POA: Diagnosis not present

## 2022-09-25 DIAGNOSIS — L729 Follicular cyst of the skin and subcutaneous tissue, unspecified: Secondary | ICD-10-CM

## 2022-09-25 MED ORDER — DOXYCYCLINE HYCLATE 100 MG PO TABS: 100.0000 mg | ORAL_TABLET | Freq: Two times a day (BID) | ORAL | 0 refills | Status: DC

## 2022-09-25 NOTE — Patient Instructions (Addendum)
       Medications changes include :   doxycyline 100 mg twice a day x 10 days    Keep doing warm compresses      Return if symptoms worsen or fail to improve.

## 2022-09-25 NOTE — Assessment & Plan Note (Signed)
Acute Left lateral buttock-infected skin cyst/boil There has been some drainage and it has gone down in size slightly, but still very infected, tender Continue warm compresses Start doxycycline 100 mg twice daily x 10 days She will monitor closely and if this does not improve or resolve she will let me know.

## 2022-10-19 ENCOUNTER — Other Ambulatory Visit: Payer: Self-pay | Admitting: Internal Medicine

## 2022-11-30 IMAGING — DX DG ANKLE COMPLETE 3+V*R*
3 series · 3 of 3 positions shown · non-contrast
Comparison: None Available.

CLINICAL DATA: Pain after fall

EXAM:
RIGHT ANKLE - COMPLETE 3+ VIEW

[ankle ap]
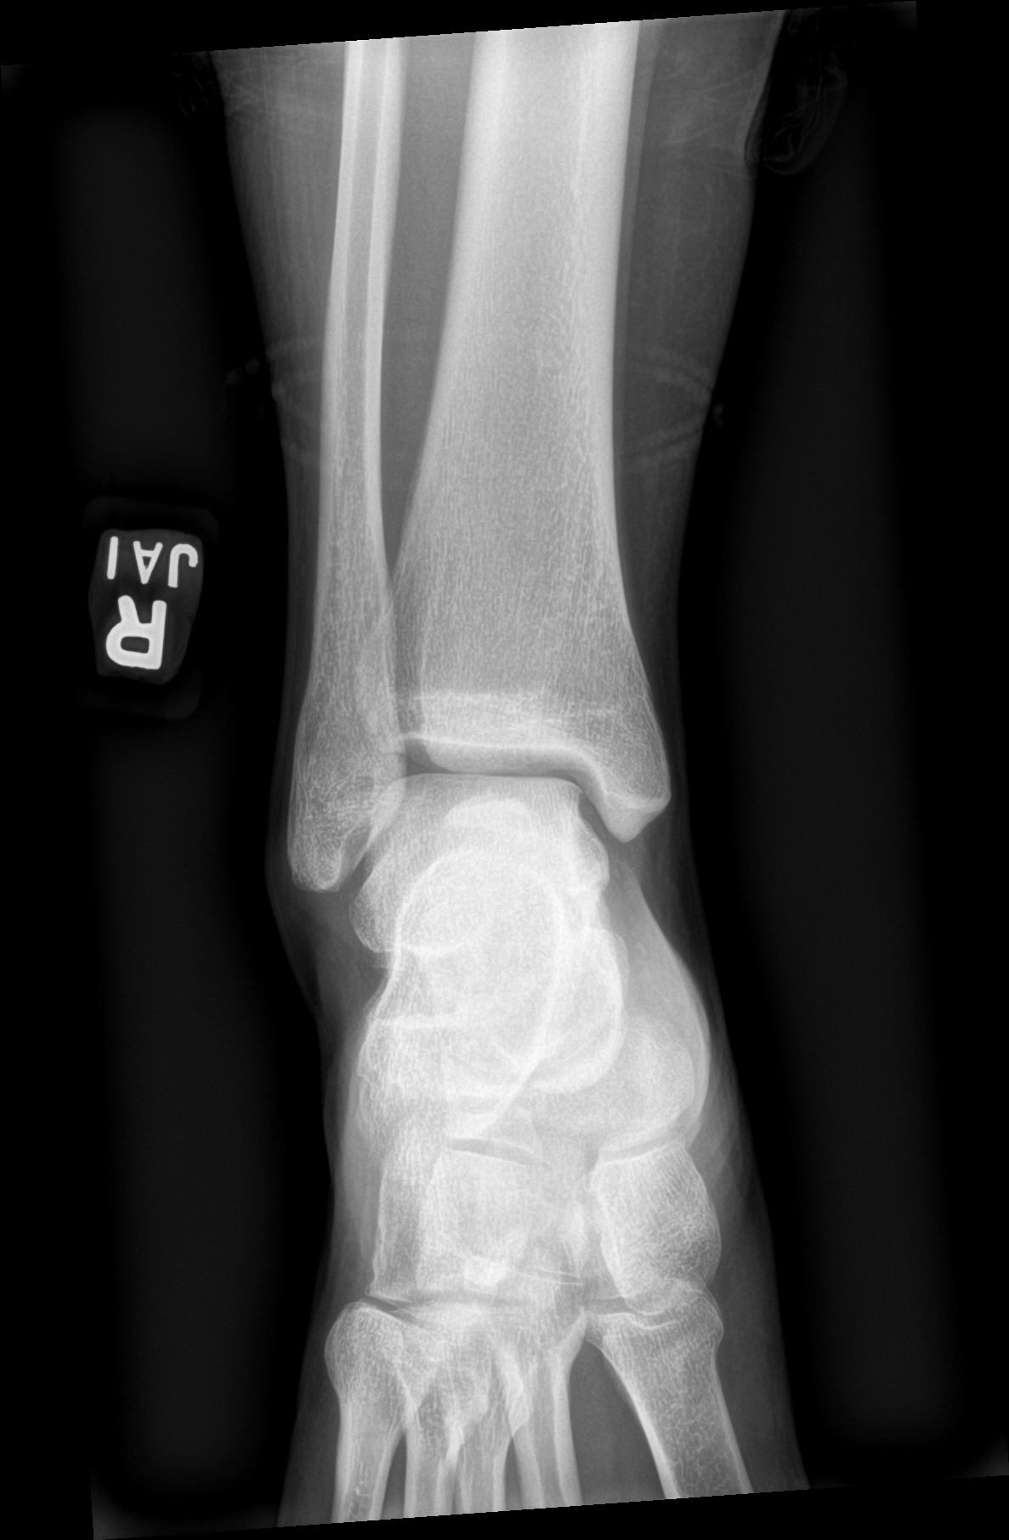

[ankle obl]
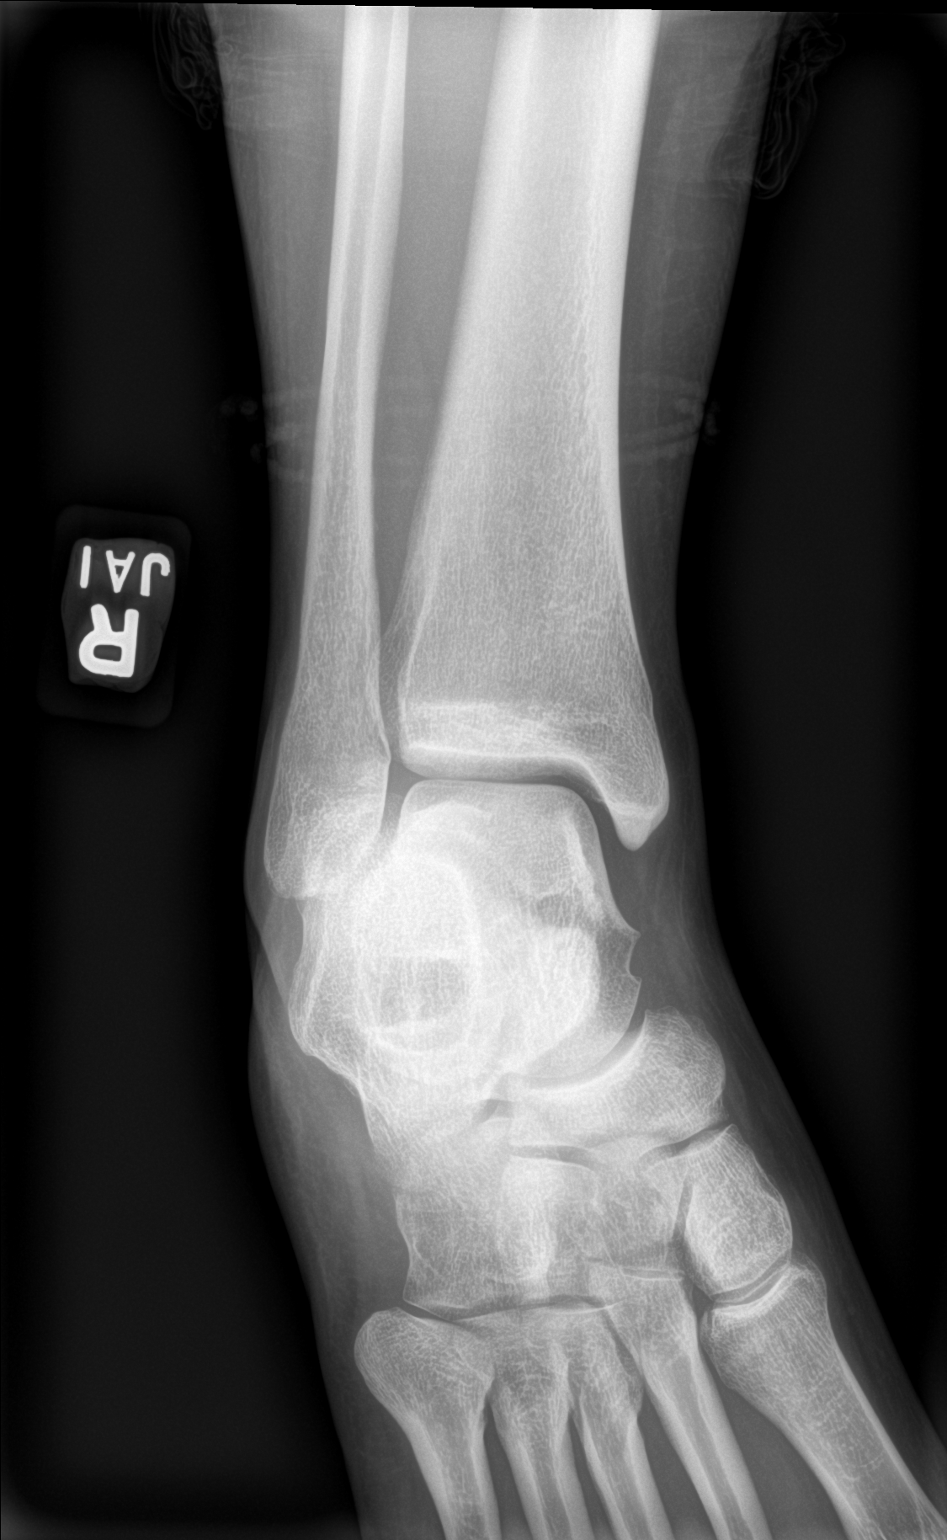

[ankle lat]
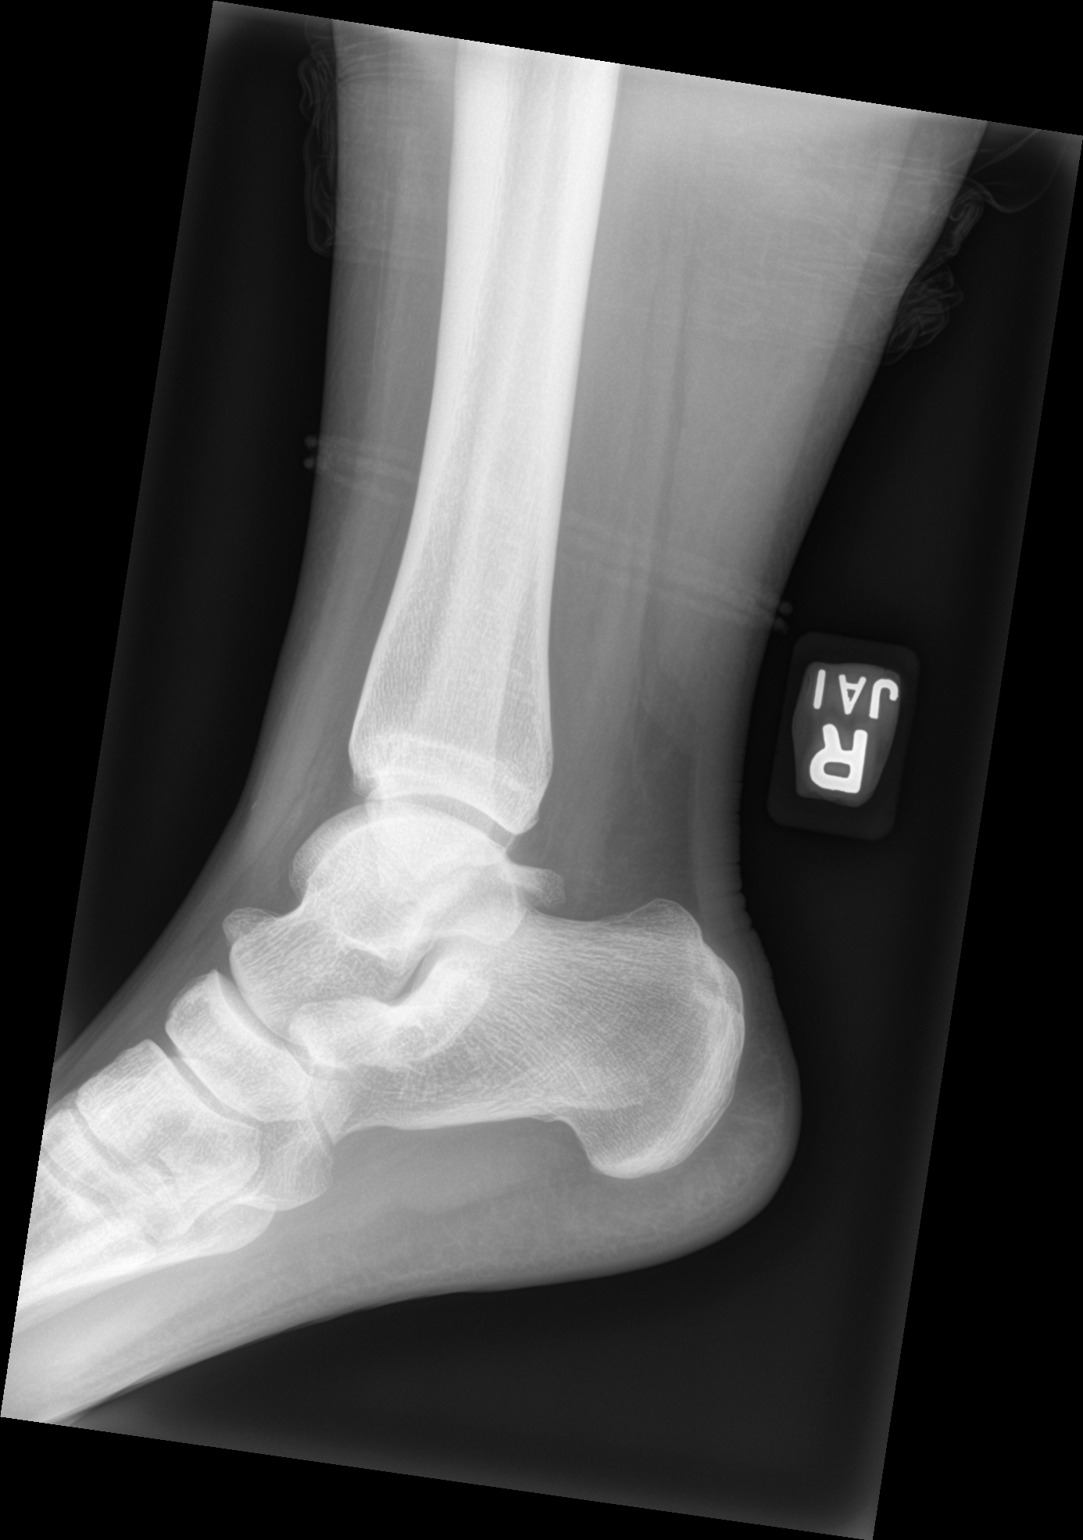

[3 of 3 positions shown; findings below may reference images not displayed]

FINDINGS: There is no evidence of fracture, dislocation, or joint effusion.
There is no evidence of arthropathy or other focal bone abnormality.
Soft tissues are unremarkable.
IMPRESSION: Negative.

## 2022-11-30 IMAGING — DX DG LUMBAR SPINE COMPLETE 4+V
5 series · 5 of 5 positions shown · non-contrast
Comparison: None Available.

CLINICAL DATA: Fall.  Pain.

EXAM:
LUMBAR SPINE - COMPLETE 4+ VIEW

[l-spine ap]
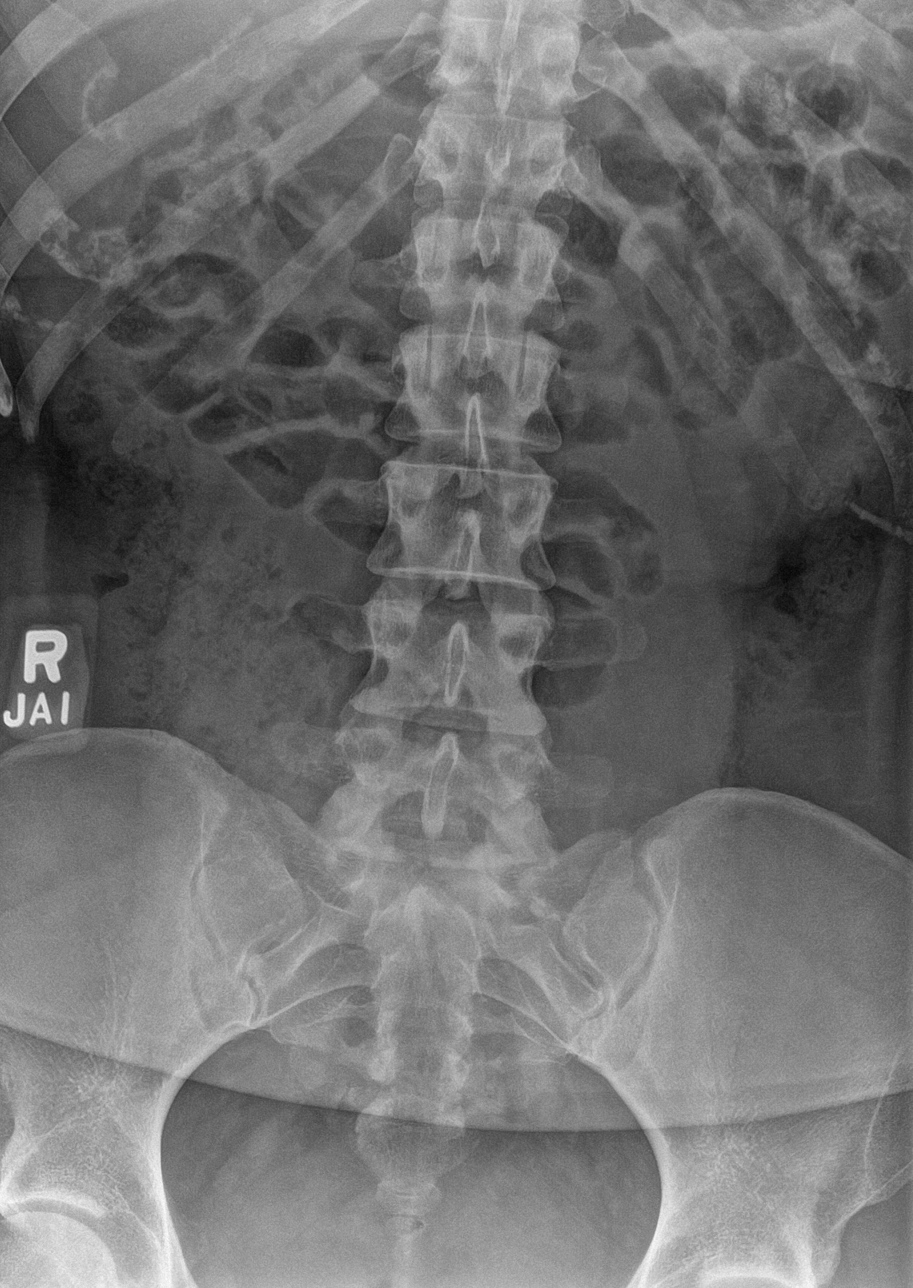

[l-spine obl (1 of 2)]
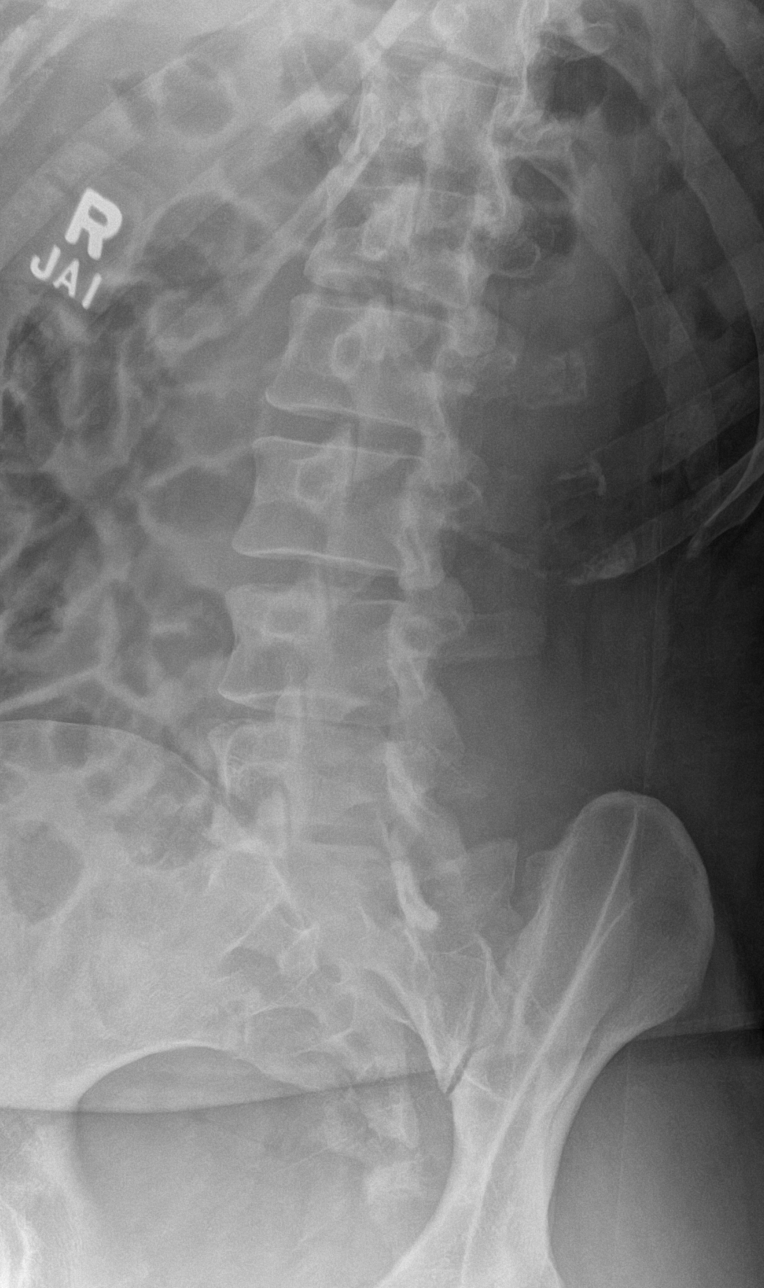

[l-spine obl (2 of 2)]
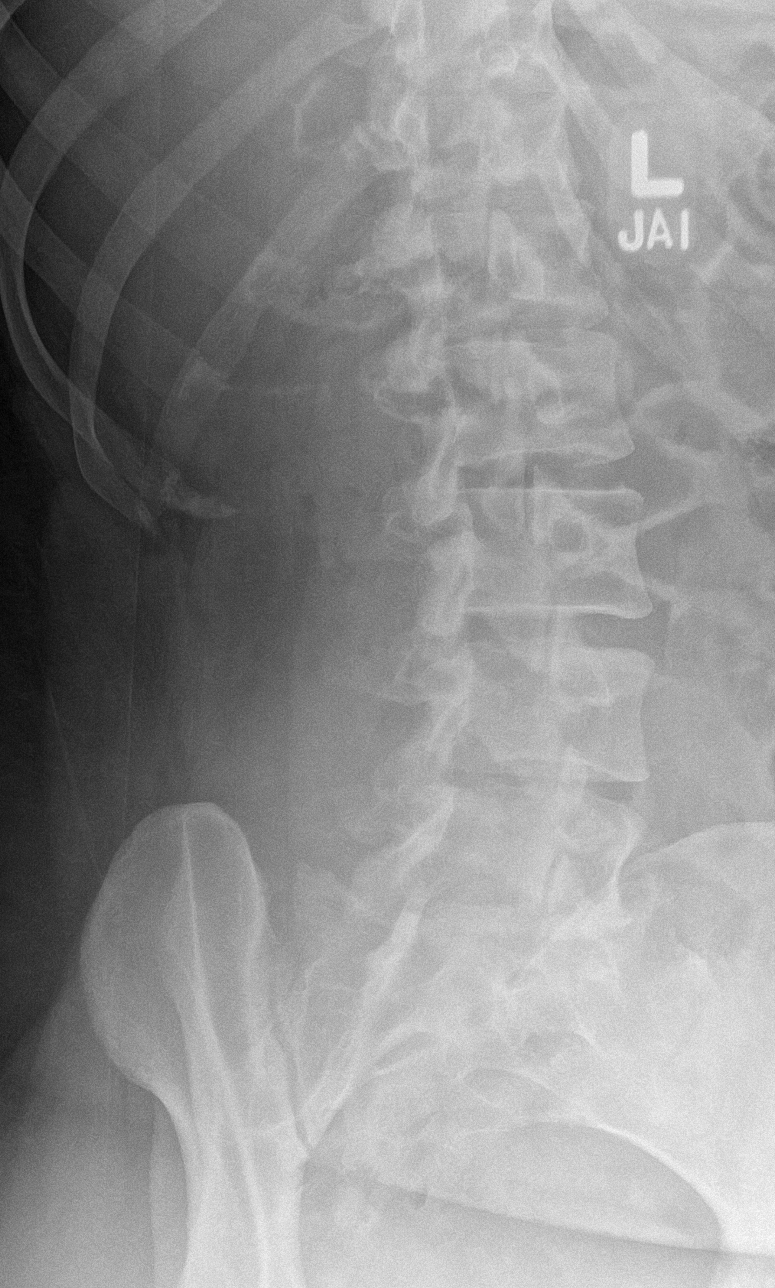

[l-spine lateral]
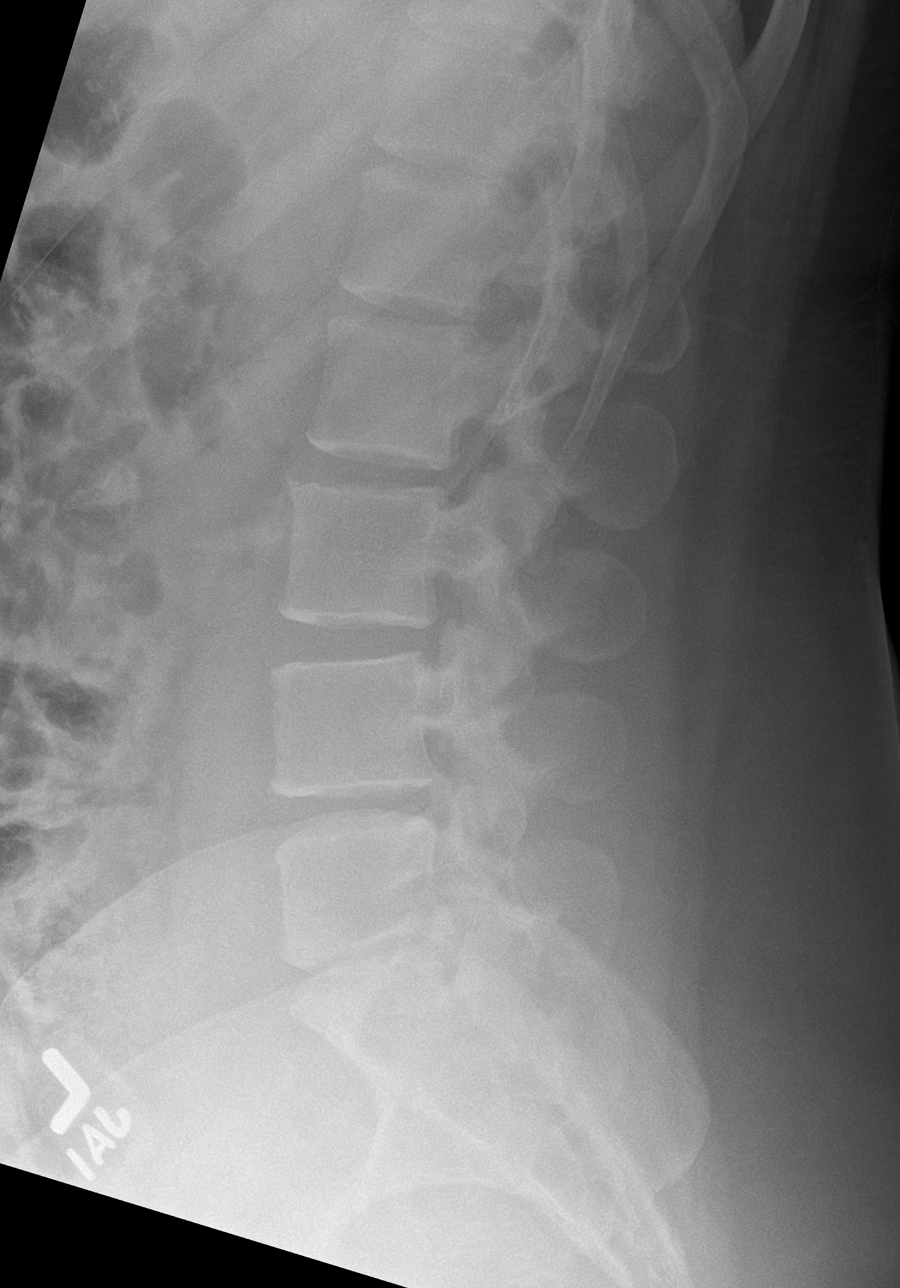

[l-spine spot]
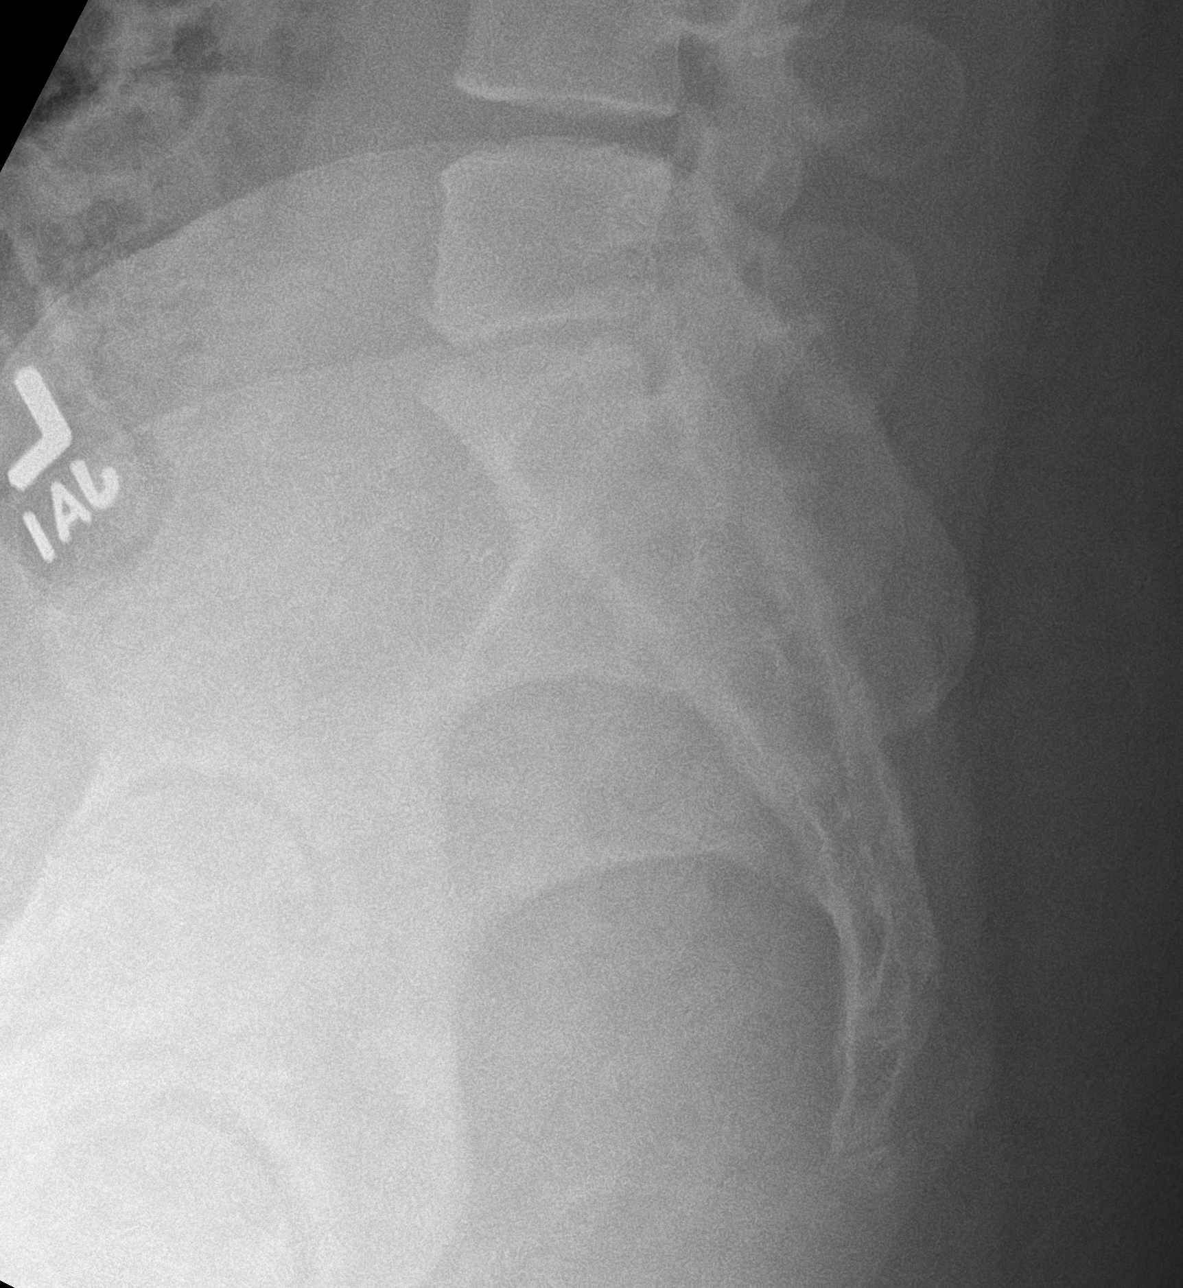

[5 of 5 positions shown; findings below may reference images not displayed]

FINDINGS: No fracture or malalignment. No significant degenerative changes. No
other acute abnormalities.
IMPRESSION: Negative.

## 2023-03-22 ENCOUNTER — Other Ambulatory Visit: Payer: Self-pay | Admitting: Internal Medicine

## 2023-04-14 ENCOUNTER — Other Ambulatory Visit: Payer: Self-pay | Admitting: Internal Medicine

## 2023-06-23 ENCOUNTER — Other Ambulatory Visit: Payer: Self-pay | Admitting: Internal Medicine

## 2023-07-20 ENCOUNTER — Other Ambulatory Visit: Payer: Self-pay | Admitting: Internal Medicine

## 2023-07-25 ENCOUNTER — Other Ambulatory Visit: Payer: Self-pay | Admitting: Internal Medicine

## 2023-08-15 ENCOUNTER — Encounter: Payer: Self-pay | Admitting: Internal Medicine

## 2023-08-15 NOTE — Progress Notes (Unsigned)
      Subjective:    Patient ID: Brandy Lee, female    DOB: 1996/07/17, 27 y.o.   MRN: [id number]     HPI Satoya is here for follow up of her chronic medical problems.   No concerns.    Has been off of her birth control for one month.  Here for refills today.  She has noticed increased headaches since being off the birth control.  Most of her headaches are menstrual related.  She is exercising regularly.     Medications and allergies reviewed with patient and updated if appropriate.  Current Outpatient Medications on File Prior to Visit  Medication Sig Dispense Refill   Cholecalciferol 25 MCG (1000 UT) tablet Take by mouth.     tretinoin (RETIN-A) 0.01 % gel Apply topically daily. 45 g 3   No current facility-administered medications on file prior to visit.     Review of Systems  Constitutional:  Negative for fever.  Respiratory:  Negative for cough, shortness of breath and wheezing.   Cardiovascular:  Negative for chest pain, palpitations and leg swelling.  Neurological:  Positive for headaches (menstrual). Negative for dizziness and light-headedness.       Objective:   Vitals:   08/16/23 1043  BP: 116/74  Pulse: 70  Temp: 98.6 F (37 C)  SpO2: 97%   BP Readings from Last 3 Encounters:  08/16/23 116/74  09/25/22 122/76  06/23/22 106/70   Wt Readings from Last 3 Encounters:  08/16/23 207 lb (93.9 kg)  09/25/22 216 lb (98 kg)  06/23/22 214 lb (97.1 kg)   Body mass index is 34.45 kg/m.    Physical Exam Constitutional:      General: She is not in acute distress.    Appearance: Normal appearance.  HENT:     Head: Normocephalic and atraumatic.  Eyes:     Conjunctiva/sclera: Conjunctivae normal.  Cardiovascular:     Rate and Rhythm: Normal rate and regular rhythm.     Heart sounds: Normal heart sounds.  Pulmonary:     Effort: Pulmonary effort is normal. No respiratory distress.     Breath sounds: Normal breath sounds. No wheezing.  Musculoskeletal:      Cervical back: Neck supple.     Right lower leg: No edema.     Left lower leg: No edema.  Lymphadenopathy:     Cervical: No cervical adenopathy.  Skin:    General: Skin is warm and dry.     Findings: No rash.  Neurological:     Mental Status: She is alert. Mental status is at baseline.  Psychiatric:        Mood and Affect: Mood normal.        Behavior: Behavior normal.        No results found for: "WBC", "HGB", "HCT", "PLT", "GLUCOSE", "CHOL", "TRIG", "HDL", "LDLDIRECT", "LDLCALC", "ALT", "AST", "NA", "K", "CL", "CREATININE", "BUN", "CO2", "TSH", "PSA", "INR", "GLUF", "HGBA1C", "MICROALBUR"   Assessment & Plan:    See Problem List for Assessment and Plan of chronic medical problems.

## 2023-08-16 ENCOUNTER — Ambulatory Visit (INDEPENDENT_AMBULATORY_CARE_PROVIDER_SITE_OTHER): Payer: 59 | Admitting: Internal Medicine

## 2023-08-16 VITALS — BP 116/74 | HR 70 | Temp 98.6°F | Ht 65.0 in | Wt 207.0 lb

## 2023-08-16 DIAGNOSIS — L700 Acne vulgaris: Secondary | ICD-10-CM | POA: Diagnosis not present

## 2023-08-16 DIAGNOSIS — E282 Polycystic ovarian syndrome: Secondary | ICD-10-CM

## 2023-08-16 MED ORDER — NORGESTIMATE-ETH ESTRADIOL 0.25-35 MG-MCG PO TABS: 1.0000 | ORAL_TABLET | Freq: Every day | ORAL | 3 refills | Status: DC

## 2023-08-16 MED ORDER — SPIRONOLACTONE 100 MG PO TABS: 100.0000 mg | ORAL_TABLET | Freq: Every day | ORAL | 3 refills | Status: DC

## 2023-08-16 MED ORDER — NORGESTIMATE-ETH ESTRADIOL 0.25-35 MG-MCG PO TABS: 1.0000 | ORAL_TABLET | Freq: Every day | ORAL | 1 refills | Status: DC

## 2023-08-16 NOTE — Patient Instructions (Addendum)
       Medications changes include :   None      Return in about 1 year (around 08/15/2024) for Physical Exam.

## 2023-08-16 NOTE — Assessment & Plan Note (Signed)
Chronic Currently taking spironolactone 100 mg daily, which is effective We will continue at current dose-prescription given for 1 year

## 2023-08-16 NOTE — Assessment & Plan Note (Signed)
Chronic Doing well on Ortho-Cyclen birth control-continue Prescription given for 1 year Currently not sexually active-has not seen a GYN, but advised that she needs to establish with them

## 2023-08-21 ENCOUNTER — Other Ambulatory Visit: Payer: Self-pay | Admitting: Internal Medicine

## 2024-09-03 ENCOUNTER — Other Ambulatory Visit: Payer: Self-pay | Admitting: Internal Medicine
